# Patient Record
Sex: Female | Born: 1985
Health system: Southern US, Community
[De-identification: ages and names within clinical notes are randomized; demographics above are authoritative.]

## PROBLEM LIST (undated history)

## (undated) DIAGNOSIS — M1990 Unspecified osteoarthritis, unspecified site: Secondary | ICD-10-CM

## (undated) HISTORY — DX: Unspecified osteoarthritis, unspecified site: M19.90

---
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
## 2005-04-22 ENCOUNTER — Emergency Department (HOSPITAL_COMMUNITY): Admission: EM | Admit: 2005-04-22 | Discharge: 2005-04-22 | Payer: Self-pay | Admitting: Emergency Medicine

## 2005-06-12 ENCOUNTER — Inpatient Hospital Stay (HOSPITAL_COMMUNITY): Admission: AD | Admit: 2005-06-12 | Discharge: 2005-06-13 | Payer: Self-pay | Admitting: Family Medicine

## 2006-03-27 ENCOUNTER — Inpatient Hospital Stay (HOSPITAL_COMMUNITY): Admission: AD | Admit: 2006-03-27 | Discharge: 2006-03-27 | Payer: Self-pay | Admitting: Obstetrics and Gynecology

## 2006-07-17 ENCOUNTER — Inpatient Hospital Stay (HOSPITAL_COMMUNITY): Admission: AD | Admit: 2006-07-17 | Discharge: 2006-07-17 | Payer: Self-pay | Admitting: Obstetrics

## 2006-08-07 ENCOUNTER — Inpatient Hospital Stay (HOSPITAL_COMMUNITY): Admission: AD | Admit: 2006-08-07 | Discharge: 2006-08-07 | Payer: Self-pay | Admitting: Obstetrics

## 2006-08-22 ENCOUNTER — Inpatient Hospital Stay (HOSPITAL_COMMUNITY): Admission: AD | Admit: 2006-08-22 | Discharge: 2006-08-22 | Payer: Self-pay | Admitting: Obstetrics

## 2006-10-11 ENCOUNTER — Inpatient Hospital Stay (HOSPITAL_COMMUNITY): Admission: AD | Admit: 2006-10-11 | Discharge: 2006-10-13 | Payer: Self-pay

## 2008-11-02 ENCOUNTER — Encounter: Admission: RE | Admit: 2008-11-02 | Discharge: 2008-11-19 | Payer: Self-pay | Admitting: Orthopedic Surgery

## 2009-06-28 ENCOUNTER — Emergency Department (HOSPITAL_COMMUNITY): Admission: EM | Admit: 2009-06-28 | Discharge: 2009-06-28 | Payer: Self-pay | Admitting: Emergency Medicine

## 2011-01-01 LAB — POCT INFECTIOUS MONO SCREEN: Mono Screen: POSITIVE — AB

## 2011-01-01 LAB — POCT RAPID STREP A (OFFICE): Streptococcus, Group A Screen (Direct): NEGATIVE

## 2011-03-02 ENCOUNTER — Emergency Department (HOSPITAL_COMMUNITY): Payer: Medicaid Other

## 2011-03-02 ENCOUNTER — Other Ambulatory Visit (HOSPITAL_COMMUNITY): Payer: Self-pay

## 2011-03-02 ENCOUNTER — Emergency Department (HOSPITAL_COMMUNITY)
Admission: EM | Admit: 2011-03-02 | Discharge: 2011-03-03 | Disposition: A | Discharge status: Still In Hospital | Payer: Medicaid Other | Source: Home / Self Care | Attending: Emergency Medicine | Admitting: Emergency Medicine

## 2011-03-02 LAB — BASIC METABOLIC PANEL
CO2: 25 mEq/L (ref 19–32)
Chloride: 101 mEq/L (ref 96–112)
GFR calc Af Amer: >60 mL/min (ref >60)
Sodium: 137 mEq/L (ref 135–145)

## 2011-03-02 LAB — WET PREP, GENITAL
Clue Cells Wet Prep HPF POC: NONE SEEN
Yeast Wet Prep HPF POC: NONE SEEN

## 2011-03-02 LAB — URINE MICROSCOPIC-ADD ON

## 2011-03-02 LAB — URINALYSIS, ROUTINE W REFLEX MICROSCOPIC
Nitrite: NEGATIVE
Protein, ur: NEGATIVE mg/dL
Urobilinogen, UA: 0.2 mg/dL (ref 0.0–1.0)

## 2011-03-02 LAB — CBC
HCT: 35.6 % — ABNORMAL LOW (ref 36.0–46.0)
Hemoglobin: 12.2 g/dL (ref 12.0–15.0)
MCH: 31 pg (ref 26.0–34.0)
MCHC: 34.3 g/dL (ref 30.0–36.0)
MCV: 90.4 fL (ref 78.0–100.0)
Platelets: 240 10*3/uL (ref 150–400)
RBC: 3.94 MIL/uL (ref 3.87–5.11)
RDW: 13.7 % (ref 11.5–15.5)
WBC: 7.2 10*3/uL (ref 4.0–10.5)

## 2011-03-02 LAB — POCT PREGNANCY, URINE: Preg Test, Ur: POSITIVE

## 2011-03-03 ENCOUNTER — Ambulatory Visit (HOSPITAL_COMMUNITY)
Admission: AD | Admit: 2011-03-03 | Discharge: 2011-03-03 | Disposition: A | Discharge status: Home / Self Care | Payer: Medicaid Other | Source: Other Acute Inpatient Hospital | Attending: Obstetrics & Gynecology | Admitting: Obstetrics & Gynecology

## 2011-03-03 ENCOUNTER — Other Ambulatory Visit: Payer: Self-pay | Admitting: Obstetrics & Gynecology

## 2011-03-03 ENCOUNTER — Inpatient Hospital Stay (HOSPITAL_COMMUNITY): Payer: Medicaid Other

## 2011-03-03 DIAGNOSIS — O00109 Unspecified tubal pregnancy without intrauterine pregnancy: Secondary | ICD-10-CM | POA: Insufficient documentation

## 2011-03-03 LAB — CBC
Hemoglobin: 11.5 g/dL — ABNORMAL LOW (ref 12.0–15.0)
MCH: 31 pg (ref 26.0–34.0)
MCV: 92.2 fL (ref 78.0–100.0)
RBC: 3.71 MIL/uL — ABNORMAL LOW (ref 3.87–5.11)
WBC: 6.5 10*3/uL (ref 4.0–10.5)

## 2011-03-03 LAB — GC/CHLAMYDIA PROBE AMP, GENITAL: GC Probe Amp, Genital: NEGATIVE

## 2011-04-03 NOTE — Op Note (Signed)
NAMECHARVI, [name]                ACCOUNT NO.:  [id number]  MEDICAL RECORD NO.:  [id number]  LOCATION:  [facility]                          FACILITY:  [facility]  PHYSICIAN:  Genia Del, M.D.DATE OF BIRTH:  23-Sep-1986  DATE OF PROCEDURE:  03/03/2011 DATE OF DISCHARGE:                              OPERATIVE REPORT   PREOPERATIVE DIAGNOSIS:  Probable right ectopic pregnancy with hemoperitoneum.  POSTOPERATIVE DIAGNOSIS:  Confirmed right tubal ectopic pregnancy with hemoperitoneum.  PROCEDURE:  Laparoscopy with right salpingostomy, removal of right ectopic pregnancy, and evacuation of hemoperitoneum.  SURGEON:  Genia Del, MD  ANESTHESIOLOGIST:  Tyrone Apple. Malen Gauze, MD  PROCEDURE IN DETAIL:  Under general anesthesia with endotracheal intubation, the patient was in lithotomy position.  She was prepped with Betadine on the abdominal, suprapubic, vulvar, and vaginal areas.  The Foley was put in place in the bladder.  The speculum was introduced in the vagina.  The anterior lip of the cervix was grasped with a tenaculum and the acorn was used to manipulate the uterus.  We removed the speculum.  We then draped the patient as usual.  We went to the abdomen and infiltration of Marcaine one-quarter plain at the infraumbilical site.  We then made a 1.2 cm incision with a scalpel, opened the aponeurosis with Mayo scissors under direct vision.  We put a pursestring stitch of Vicryl 0 at the aponeurosis, introduced the Hasson at that level and the camera.  We inspected the abdominopelvic cavities and noted about 500 mL of hemoperitoneum.  We then put a 5-mm trocar on the right iliac area and another 5 on the left iliac area.  We infiltrated the skin with Marcaine one-quarter at both sides, used a scalpel for a 5-mm incision, and introduced those ports under direct vision.  We abundantly irrigated and suctioned the abdominopelvic cavities.  We noted a normal uterus in size and appearance,  normal left tube and normal left ovary.  On the right side, the right ovary is normal in size and appearance except for a large organized blood clot covering it and covering also the distal end of the tube.  There was no active bleeding but it seems like the patient had bled through the distal portion of the tube.  The tube was not ruptured but was distended with an ampullary ectopic pregnancy.  We used the point on the Nezhat to make a 2.5 cm incision apposite to the mesosalpinx.  We used the irrigation to lift the ectopic pregnancy at that level.  It was completely evacuated and removed with the clamps and sent to pathology. We then irrigated further, completed hemostasis with the point of Nezhat with coag mode.  We completed hemostasis on the serosa of the right tube edge.  Hemostasis was then verified and adequate.  We irrigated and suctioned again.  Pictures were taken before proceeding with evacuation of the hemoperitoneum and salpingostomy.  Pictures were also taken after.  No pathology was seen in the abdomen.  We had about 500 mL of hemoperitoneum when the procedure was started.  Estimated blood loss other than that was minimal.  All instruments were removed.  The trocars were removed under direct vision.  Pneumoperitoneum was evacuated.  We then closed the aponeurosis at the infraumbilical incision by attaching the pursestring stitch. We closed the 3 skin incision with a subcuticular stitch of Vicryl 4-0 and applied Dermabond on all incisions.  We also removed the instruments from the vagina.  The patient received Ancef 1 gram IV before starting the procedure.  The count of instruments and sponges was complete.  She was brought to recovery room in good and stable status.     Genia Del, M.D.     ML/MEDQ  D:  03/03/2011  T:  03/03/2011  Job:  [id number]  Electronically Signed by Genia Del M.D. on 04/03/2011 09:47:40 PM

## 2012-04-26 ENCOUNTER — Emergency Department (HOSPITAL_COMMUNITY): Payer: Medicaid Other

## 2012-04-26 ENCOUNTER — Encounter (HOSPITAL_COMMUNITY): Payer: Self-pay | Admitting: Cardiology

## 2012-04-26 ENCOUNTER — Emergency Department (HOSPITAL_COMMUNITY)
Admission: EM | Admit: 2012-04-26 | Discharge: 2012-04-26 | Disposition: A | Discharge status: Home / Self Care | Payer: Medicaid Other | Attending: Emergency Medicine | Admitting: Emergency Medicine

## 2012-04-26 DIAGNOSIS — O039 Complete or unspecified spontaneous abortion without complication: Secondary | ICD-10-CM

## 2012-04-26 LAB — POCT I-STAT, CHEM 8
Calcium, Ion: 1.24 mmol/L — ABNORMAL HIGH (ref 1.12–1.23)
Chloride: 104 mEq/L (ref 96–112)
Creatinine, Ser: 0.6 mg/dL (ref 0.50–1.10)
Glucose, Bld: 81 mg/dL (ref 70–99)
HCT: 36 % (ref 36.0–46.0)

## 2012-04-26 LAB — URINALYSIS, ROUTINE W REFLEX MICROSCOPIC
Glucose, UA: NEGATIVE mg/dL
Specific Gravity, Urine: 1.017 (ref 1.005–1.030)
Urobilinogen, UA: 1 mg/dL (ref 0.0–1.0)

## 2012-04-26 LAB — URINE MICROSCOPIC-ADD ON

## 2012-04-26 LAB — POCT PREGNANCY, URINE: Preg Test, Ur: POSITIVE — AB

## 2012-04-26 MED ORDER — OXYCODONE-ACETAMINOPHEN 5-325 MG PO TABS: 1.0000 | ORAL_TABLET | Freq: Four times a day (QID) | ORAL | Status: AC | PRN

## 2012-04-26 NOTE — ED Notes (Signed)
Pt reports that she is about 3 months pregnant and started having vaginal bleeding this morning. Reports using about 4 pads since 0500.

## 2012-04-26 NOTE — ED Provider Notes (Signed)
Medical screening examination/treatment/procedure(s) were conducted as a shared visit with non-physician practitioner(s) and myself.  I personally evaluated the patient during the encounter Is having vaginal bleeding and cramping. Ultrasound at bedside was inconclusive due to patient recently urinating. Formal ultrasound shows concern miscarriage with positive products of conception. Patient will followup with GYN for ongoing quadrants.  Kathleen Sprout, MD 04/26/12 1606

## 2012-04-26 NOTE — ED Provider Notes (Signed)
History     CSN: [id number]  Arrival date & time 04/26/12  1017   First MD Initiated Contact with Patient 04/26/12 1028      Chief Complaint  Patient presents with  . Vaginal Bleeding    (Consider location/radiation/quality/duration/timing/severity/associated sxs/prior treatment) HPI Comments: Patient is G3 P1, approximately [redacted] weeks pregnant presents with vaginal bleeding and lower abdominal and back cramping since yesterday. Patient states that the bleeding became heavier this morning. She has used about 4 pads since 5 AM. Patient also noted the passage of some clear fluid. She has not tried anything for the symptoms. Patient had an ultrasound performed approximately 2 weeks ago which she said was normal. Patient had a possible right side are ruptured ectopic pregnancy June 2012. She denies fever, nausea or vomiting, dysuria. Nothing makes symptoms better or worse. Onset was acute. Course is gradually worsening.  Patient is a 26 y.o. female presenting with vaginal bleeding. The history is provided by the patient.  Vaginal Bleeding This is a new problem. The current episode started yesterday. The problem occurs constantly. The problem has been unchanged. Associated symptoms include abdominal pain. Pertinent negatives include no anorexia, chest pain, coughing, fever, headaches, myalgias, nausea, rash, sore throat or vomiting. Nothing aggravates the symptoms. She has tried nothing for the symptoms.    History reviewed. No pertinent past medical history.  History reviewed. No pertinent past surgical history.  History reviewed. No pertinent family history.  History  Substance Use Topics  . Smoking status: Not on file  . Smokeless tobacco: Not on file  . Alcohol Use: Not on file    OB History    Grav Para Term Preterm Abortions TAB SAB Ect Mult Living                  Review of Systems  Constitutional: Negative for fever.  HENT: Negative for sore throat and rhinorrhea.   Eyes:  Negative for redness.  Respiratory: Negative for cough.   Cardiovascular: Negative for chest pain.  Gastrointestinal: Positive for abdominal pain. Negative for nausea, vomiting, diarrhea and anorexia.  Genitourinary: Positive for vaginal bleeding. Negative for dysuria.  Musculoskeletal: Negative for myalgias.  Skin: Negative for rash.  Neurological: Negative for headaches.    Allergies  Review of patient's allergies indicates no known allergies.  Home Medications  No current outpatient prescriptions on file.  BP 146/87  Pulse 63  Temp 97.2 F (36.2 C) (Oral)  Resp 18  SpO2 100%  Physical Exam  Nursing note and vitals reviewed. Constitutional: She appears well-developed and well-nourished.  HENT:  Head: Normocephalic and atraumatic.  Eyes: Conjunctivae are normal. Right eye exhibits no discharge. Left eye exhibits no discharge.  Neck: Normal range of motion. Neck supple.  Cardiovascular: Normal rate, regular rhythm and normal heart sounds.   Pulmonary/Chest: Effort normal and breath sounds normal.  Abdominal: Soft. There is tenderness in the right lower quadrant, suprapubic area and left lower quadrant. There is no rigidity, no rebound, no guarding, no CVA tenderness, no tenderness at McBurney's point and negative Murphy's sign.  Genitourinary: Cervix exhibits discharge. Right adnexum displays no mass, no tenderness and no fullness. Left adnexum displays no mass and no fullness. Vaginal discharge found.       Moderate amount of dark red blood noted in vaginal vault, ?Products of conception, amniotic sac in vaginal tract.  Neurological: She is alert.  Skin: Skin is warm and dry.  Psychiatric: She has a normal mood and affect.    ED  Course  Procedures (including critical care time)  Labs Reviewed  URINALYSIS, ROUTINE W REFLEX MICROSCOPIC - Abnormal; Notable for the following:    APPearance CLOUDY (*)     Hgb urine dipstick LARGE (*)     Ketones, ur 15 (*)     Nitrite  POSITIVE (*)     Leukocytes, UA SMALL (*)     All other components within normal limits  POCT PREGNANCY, URINE - Abnormal; Notable for the following:    Preg Test, Ur POSITIVE (*)     All other components within normal limits  URINE MICROSCOPIC-ADD ON - Abnormal; Notable for the following:    Squamous Epithelial / LPF FEW (*)     Bacteria, UA MANY (*)     All other components within normal limits  POCT I-STAT, CHEM 8 - Abnormal; Notable for the following:    BUN <3 (*)     Calcium, Ion 1.24 (*)     All other components within normal limits  HCG, QUANTITATIVE, PREGNANCY   US Ob Comp Less 14 Wks  04/26/2012  *RADIOLOGY REPORT*  Clinical Data: Abnormal bleeding; EGA by LMP is 10 weeks 6 days  OBSTETRIC <14 WK [place] AND TRANSVAGINAL OB US  Technique:  Both transabdominal and transvaginal ultrasound examinations were performed for complete evaluation of the gestation as well as the maternal uterus, adnexal regions, and pelvic cul-de-sac.  Transvaginal technique was performed to assess early pregnancy.  Comparison:  None.  There is no beta HCG level for correlation.  Intrauterine gestational sac:  None  Yolk sac: None Embryo: No Cardiac Activity: No  Maternal uterus/adnexae: The endometrial stripe at the fundus is thin, measuring 11 mm. There is a thick, heterogeneous material at the lower uterine segment, however.  Both ovaries have a normal size and appearance.  There are no adnexal masses or free pelvic fluid.  IMPRESSION: There is no evidence of intrauterine or ectopic pregnancy.  There is heterogeneous material at the lower uterine segment which is worrisome for hemorrhagic products and passing products of conception with spontaneous abortion, given history of heavy bleeding and positive pregnancy test.  Original Report Authenticated By: Brandon Melnick, M.D.   US Ob Transvaginal  04/26/2012  *RADIOLOGY REPORT*  Clinical Data: Abnormal bleeding; EGA by LMP is 10 weeks 6 days  OBSTETRIC <14 WK [place] AND  TRANSVAGINAL OB US  Technique:  Both transabdominal and transvaginal ultrasound examinations were performed for complete evaluation of the gestation as well as the maternal uterus, adnexal regions, and pelvic cul-de-sac.  Transvaginal technique was performed to assess early pregnancy.  Comparison:  None.  There is no beta HCG level for correlation.  Intrauterine gestational sac:  None  Yolk sac: None Embryo: No Cardiac Activity: No  Maternal uterus/adnexae: The endometrial stripe at the fundus is thin, measuring 11 mm. There is a thick, heterogeneous material at the lower uterine segment, however.  Both ovaries have a normal size and appearance.  There are no adnexal masses or free pelvic fluid.  IMPRESSION: There is no evidence of intrauterine or ectopic pregnancy.  There is heterogeneous material at the lower uterine segment which is worrisome for hemorrhagic products and passing products of conception with spontaneous abortion, given history of heavy bleeding and positive pregnancy test.  Original Report Authenticated By: Brandon Melnick, M.D.     1. Miscarriage     10:56 AM Patient seen and examined. Work-up initiated.    Vital signs reviewed and are as follows: Filed Vitals:  04/26/12 1028  BP: 146/87  Pulse: 63  Temp: 97.2 F (36.2 C)  Resp: 18   [place] confirmed miscarriage. Istat, quant ordered for follow-up purposes. Patient informed.   Urged OB follow-up. Urged return if worsening severe bleeding, lightheadedness, SOB. Told patient to expect passage of products of conception. Patient verbalizes understanding and agrees with plan.   MDM  Patient with miscarriage, supported by physical exam and US findings. Do not suspect clinically significant hemorrhage. Patient has OB follow-up. She appears well at time of discharge.         Renne Crigler, [place] 04/26/12 1550

## 2013-01-07 ENCOUNTER — Emergency Department (HOSPITAL_COMMUNITY)
Admission: EM | Admit: 2013-01-07 | Discharge: 2013-01-07 | Disposition: A | Discharge status: Home / Self Care | Payer: Self-pay | Attending: Emergency Medicine | Admitting: Emergency Medicine

## 2013-01-07 ENCOUNTER — Encounter (HOSPITAL_COMMUNITY): Payer: Self-pay | Admitting: Physical Medicine and Rehabilitation

## 2013-01-07 DIAGNOSIS — Z87891 Personal history of nicotine dependence: Secondary | ICD-10-CM | POA: Insufficient documentation

## 2013-01-07 DIAGNOSIS — J029 Acute pharyngitis, unspecified: Secondary | ICD-10-CM | POA: Insufficient documentation

## 2013-01-07 MED ORDER — PREDNISOLONE SODIUM PHOSPHATE 15 MG/5ML PO SOLN
30.0000 mg | Freq: Once | ORAL | Status: AC
  Administered 2013-01-07: 30 mg via ORAL
  Filled 2013-01-07: qty 2

## 2013-01-07 MED ORDER — PENICILLIN G BENZATHINE 1200000 UNIT/2ML IM SUSP
1.2000 10*6.[IU] | Freq: Once | INTRAMUSCULAR | Status: AC
  Administered 2013-01-07: 1.2 10*6.[IU] via INTRAMUSCULAR
  Filled 2013-01-07: qty 2

## 2013-01-07 NOTE — ED Notes (Signed)
Pt presents to department for evaluation of sore throat. Ongoing x3 days. Pt states no relief of symptoms at home. 8/10 pain at the time. Pt is alert and oriented x4.

## 2013-01-07 NOTE — ED Provider Notes (Signed)
History  This chart was scribed for non-physician practitioner Roxy Horseman, PA-C working with Glynn Octave, MD, by Candelaria Stagers, ED Scribe. This patient was seen in room TR06C/TR06C and the patient's care was started at 3:57 PM   CSN: [id number]  Arrival date & time 01/07/13  1542   First MD Initiated Contact with Patient 01/07/13 1550      Chief Complaint  Patient presents with  . Sore Throat    The history is provided by the patient. No language interpreter was used.   [name] is a 27 y.o. female who presents to the Emergency Department complaining of severe sore throat that started three days ago.  Swallowing makes the pain worse.  Pt denies fever, nausea, or vomiting.  She reports some ill contacts at work.  Pt has taken nothing for the pain.   No past medical history on file.  No past surgical history on file.  No family history on file.  History  Substance Use Topics  . Smoking status: Former Games developer  . Smokeless tobacco: Not on file  . Alcohol Use: No    OB History   Grav Para Term Preterm Abortions TAB SAB Ect Mult Living                  Review of Systems  Constitutional: Negative for fever and chills.  HENT: Positive for sore throat.   Gastrointestinal: Negative for nausea and vomiting.  All other systems reviewed and are negative.    Allergies  Review of patient's allergies indicates no known allergies.  Home Medications  No current outpatient prescriptions on file.  BP 131/84  Pulse 68  Temp(Src) 98.7 F (37.1 C) (Oral)  Resp 18  SpO2 97%  Physical Exam  Nursing note and vitals reviewed. Constitutional: She is oriented to person, place, and time. She appears well-developed and well-nourished. No distress.  HENT:  Head: Normocephalic and atraumatic.  Oropharynx red and inflamed with tonsillar exudates. Airway intact.  Uvula midline.  No signs of tonsillar or peri tonsillar abscesses.   Eyes: EOM are normal.  Neck: Normal range  of motion. Neck supple. No tracheal deviation present.  Cardiovascular: Normal rate, regular rhythm and normal heart sounds.  Exam reveals no gallop and no friction rub.   No murmur heard. Pulmonary/Chest: Effort normal and breath sounds normal. No respiratory distress. She has no wheezes. She has no rales.  Musculoskeletal: Normal range of motion.  Lymphadenopathy:    She has cervical adenopathy.  Neurological: She is alert and oriented to person, place, and time.  Skin: Skin is warm and dry.  Psychiatric: She has a normal mood and affect. Her behavior is normal.    ED Course  Procedures   DIAGNOSTIC STUDIES: Oxygen Saturation is 97% on room air, normal by my interpretation.    COORDINATION OF CARE:  4:00 PM Discussed course of care with pt which includes antibiotic and steroid.  Pt understands and agrees.    Labs Reviewed - No data to display No results found.   1. Pharyngitis       MDM  Patient with pharyngitis. No signs of abscess or Ludwig's angina. Presence of exudates, cervical adenopathy, and abscess of cough.  Patient has not measured a temperature.  Will treat with penicillin IM and give a dose of orapred.  Follow-up with PCP.  Tylenol and motrin for pain control.    I personally performed the services described in this documentation, which was scribed in my presence. The  recorded information has been reviewed and is accurate.        Roxy Horseman, PA-C 01/07/13 1609

## 2013-01-07 NOTE — ED Provider Notes (Signed)
Medical screening examination/treatment/procedure(s) were performed by non-physician practitioner and as supervising physician I was immediately available for consultation/collaboration.   Jaquise Faux, MD 01/07/13 2020 

## 2014-05-11 ENCOUNTER — Emergency Department (HOSPITAL_COMMUNITY)
Admission: EM | Admit: 2014-05-11 | Discharge: 2014-05-11 | Disposition: A | Discharge status: Home / Self Care | Payer: Medicaid Other | Attending: Emergency Medicine | Admitting: Emergency Medicine

## 2014-05-11 ENCOUNTER — Encounter (HOSPITAL_COMMUNITY): Payer: Self-pay | Admitting: Emergency Medicine

## 2014-05-11 ENCOUNTER — Emergency Department (HOSPITAL_COMMUNITY): Payer: Medicaid Other

## 2014-05-11 DIAGNOSIS — S8990XA Unspecified injury of unspecified lower leg, initial encounter: Secondary | ICD-10-CM | POA: Insufficient documentation

## 2014-05-11 DIAGNOSIS — Z87891 Personal history of nicotine dependence: Secondary | ICD-10-CM | POA: Insufficient documentation

## 2014-05-11 DIAGNOSIS — S99919A Unspecified injury of unspecified ankle, initial encounter: Secondary | ICD-10-CM | POA: Insufficient documentation

## 2014-05-11 DIAGNOSIS — M79671 Pain in right foot: Secondary | ICD-10-CM

## 2014-05-11 DIAGNOSIS — Y92009 Unspecified place in unspecified non-institutional (private) residence as the place of occurrence of the external cause: Secondary | ICD-10-CM | POA: Insufficient documentation

## 2014-05-11 DIAGNOSIS — Y9389 Activity, other specified: Secondary | ICD-10-CM | POA: Insufficient documentation

## 2014-05-11 DIAGNOSIS — S99929A Unspecified injury of unspecified foot, initial encounter: Principal | ICD-10-CM

## 2014-05-11 DIAGNOSIS — R296 Repeated falls: Secondary | ICD-10-CM | POA: Insufficient documentation

## 2014-05-11 MED ORDER — NAPROXEN 500 MG PO TABS: 500.0000 mg | ORAL_TABLET | Freq: Two times a day (BID) | ORAL | Status: DC

## 2014-05-11 NOTE — ED Notes (Signed)
Rt foot pain  Since, yesterday fell off trash can trying to get into house she had locked herself out of good pulse can wiggle toes and good neuro and blanche

## 2014-05-11 NOTE — Discharge Instructions (Signed)
Take the prescribed medication as directed. °Return to the ED for new or worsening symptoms. ° °

## 2014-05-11 NOTE — ED Provider Notes (Signed)
Medical screening examination/treatment/procedure(s) were performed by non-physician practitioner and as supervising physician I was immediately available for consultation/collaboration.   EKG Interpretation None        Lyanne CoKevin M Starlet Gallentine, MD 05/11/14 2204

## 2014-05-11 NOTE — ED Provider Notes (Signed)
CSN: [id number]     Arrival date & time 05/11/14  [id number] History   First MD Initiated Contact with Patient 05/11/14 [phone]     Chief Complaint  Patient presents with  . Foot Pain     (Consider location/radiation/quality/duration/timing/severity/associated sxs/prior Treatment) The history is provided by the patient and medical records.   This is a 28 y.o. F with no significant PMH presenting to the ED for right foot pain.  Patient states yesterday she was standing on a trash can attempting to crawl into her home after she locked herself out of the house, states trash can tipped over and she fell on her right foot with it dorsiflexed.  No head injury or LOC. States she has been ambulatory since fall but is limping.  Did have some numbness and paresthesias in her foot last night, however this has since resolved.  Has elevated and iced her foot without noted improvement.  Prior right ankle injury several years ago.  History reviewed. No pertinent past medical history. History reviewed. No pertinent past surgical history. No family history on file. History  Substance Use Topics  . Smoking status: Former Games developermoker  . Smokeless tobacco: Not on file  . Alcohol Use: No   OB History   Grav Para Term Preterm Abortions TAB SAB Ect Mult Living                 Review of Systems  Musculoskeletal: Positive for arthralgias.  All other systems reviewed and are negative.     Allergies  Review of patient's allergies indicates no known allergies.  Home Medications   Prior to Admission medications   Not on File   There were no vitals taken for this visit.  Physical Exam  Nursing note and vitals reviewed. Constitutional: She is oriented to person, place, and time. She appears well-developed and well-nourished.  HENT:  Head: Normocephalic and atraumatic.  Mouth/Throat: Oropharynx is clear and moist.  Eyes: Conjunctivae and EOM are normal. Pupils are equal, round, and reactive to light.  Neck:  Normal range of motion.  Cardiovascular: Normal rate, regular rhythm and normal heart sounds.   Pulmonary/Chest: Effort normal and breath sounds normal. No respiratory distress. She has no wheezes.  Musculoskeletal: Normal range of motion.       Right foot: She exhibits tenderness, bony tenderness and swelling.  Mild swelling and tenderness to right dorsal foot, no gross bony deformity, moving all toes appropriately, full range of motion of the ankle, DP pulse and sensation intact  Neurological: She is alert and oriented to person, place, and time.  Skin: Skin is warm and dry.  Psychiatric: She has a normal mood and affect.    ED Course  Procedures (including critical care time) Labs Review Labs Reviewed - No data to display  Imaging Review No results found.   EKG Interpretation None      MDM   Final diagnoses:  Foot pain, right   Imaging negative for acute fx.  Foot remains NVI.  Foot ace wrapped, patient discharged home with naprosyn.  Continue RICE routine.  Discussed plan with patient, he/she acknowledged understanding and agreed with plan of care.  Return precautions given for new or worsening symptoms.  Garlon HatchetLisa M Samuella Rasool, PA-C 05/11/14 1003

## 2015-01-01 ENCOUNTER — Encounter (HOSPITAL_COMMUNITY): Payer: Self-pay | Admitting: Emergency Medicine

## 2015-01-01 ENCOUNTER — Emergency Department (HOSPITAL_COMMUNITY): Payer: Medicaid Other

## 2015-01-01 ENCOUNTER — Emergency Department (HOSPITAL_COMMUNITY)
Admission: EM | Admit: 2015-01-01 | Discharge: 2015-01-01 | Disposition: A | Discharge status: Home / Self Care | Payer: Medicaid Other | Attending: Emergency Medicine | Admitting: Emergency Medicine

## 2015-01-01 DIAGNOSIS — Y9289 Other specified places as the place of occurrence of the external cause: Secondary | ICD-10-CM | POA: Insufficient documentation

## 2015-01-01 DIAGNOSIS — W010XXA Fall on same level from slipping, tripping and stumbling without subsequent striking against object, initial encounter: Secondary | ICD-10-CM | POA: Insufficient documentation

## 2015-01-01 DIAGNOSIS — Z791 Long term (current) use of non-steroidal anti-inflammatories (NSAID): Secondary | ICD-10-CM | POA: Insufficient documentation

## 2015-01-01 DIAGNOSIS — Y9389 Activity, other specified: Secondary | ICD-10-CM | POA: Insufficient documentation

## 2015-01-01 DIAGNOSIS — Z87891 Personal history of nicotine dependence: Secondary | ICD-10-CM | POA: Insufficient documentation

## 2015-01-01 DIAGNOSIS — M25462 Effusion, left knee: Secondary | ICD-10-CM

## 2015-01-01 DIAGNOSIS — S8992XA Unspecified injury of left lower leg, initial encounter: Secondary | ICD-10-CM | POA: Insufficient documentation

## 2015-01-01 DIAGNOSIS — R52 Pain, unspecified: Secondary | ICD-10-CM

## 2015-01-01 DIAGNOSIS — Y998 Other external cause status: Secondary | ICD-10-CM | POA: Insufficient documentation

## 2015-01-01 MED ORDER — NAPROXEN 500 MG PO TABS: 500.0000 mg | ORAL_TABLET | Freq: Two times a day (BID) | ORAL | Status: DC

## 2015-01-01 NOTE — ED Notes (Addendum)
Pt has left knee pain; can bear weight but too painful. Been hopping around. Iced yesterday and helped some. Pt slipped and fell and thinks knee popped out 2 days ago.

## 2015-01-01 NOTE — Discharge Instructions (Signed)
Take naproxen as directed. When you are at home, you may take the knee immobilizer off, elevate your knee and ice it. Wear the immobilizer overnight. Follow-up with orthopedics.  Knee Effusion The medical term for having fluid in your knee is effusion. This is often due to an internal derangement of the knee. This means something is wrong inside the knee. Some of the causes of fluid in the knee may be torn cartilage, a torn ligament, or bleeding into the joint from an injury. Your knee is likely more difficult to bend and move. This is often because there is increased pain and pressure in the joint. The time it takes for recovery from a knee effusion depends on different factors, including:   Type of injury.  Your age.  Physical and medical conditions.  Rehabilitation Strategies. How long you will be away from your normal activities will depend on what kind of knee problem you have and how much damage is present. Your knee has two types of cartilage. Articular cartilage covers the bone ends and lets your knee bend and move smoothly. Two menisci, thick pads of cartilage that form a rim inside the joint, help absorb shock and stabilize your knee. Ligaments bind the bones together and support your knee joint. Muscles move the joint, help support your knee, and take stress off the joint itself. CAUSES  Often an effusion in the knee is caused by an injury to one of the menisci. This is often a tear in the cartilage. Recovery after a meniscus injury depends on how much meniscus is damaged and whether you have damaged other knee tissue. Small tears may heal on their own with conservative treatment. Conservative means rest, limited weight bearing activity and muscle strengthening exercises. Your recovery may take up to 6 weeks.  TREATMENT  Larger tears may require surgery. Meniscus injuries may be treated during arthroscopy. Arthroscopy is a procedure in which your surgeon uses a small telescope like  instrument to look in your knee. Your caregiver can make a more accurate diagnosis (learning what is wrong) by performing an arthroscopic procedure. If your injury is on the inner margin of the meniscus, your surgeon may trim the meniscus back to a smooth rim. In other cases your surgeon will try to repair a damaged meniscus with stitches (sutures). This may make rehabilitation take longer, but may provide better long term result by helping your knee keep its shock absorption capabilities. Ligaments which are completely torn usually require surgery for repair. HOME CARE INSTRUCTIONS  Use crutches as instructed.  If a brace is applied, use as directed.  Once you are home, an ice pack applied to your swollen knee may help with discomfort and help decrease swelling.  Keep your knee raised (elevated) when you are not up and around or on crutches.  Only take over-the-counter or prescription medicines for pain, discomfort, or fever as directed by your caregiver.  Your caregivers will help with instructions for rehabilitation of your knee. This often includes strengthening exercises.  You may resume a normal diet and activities as directed. SEEK MEDICAL CARE IF:   There is increased swelling in your knee.  You notice redness, swelling, or increasing pain in your knee.  An unexplained oral temperature above 102 F (38.9 C) develops. SEEK IMMEDIATE MEDICAL CARE IF:   You develop a rash.  You have difficulty breathing.  You have any allergic reactions from medications you may have been given.  There is severe pain with any motion of  the knee. MAKE SURE YOU:   Understand these instructions.  Will watch your condition.  Will get help right away if you are not doing well or get worse. Document Released: 12/05/2003 Document Revised: 12/07/2011 Document Reviewed: 02/08/2008 [facility] Patient Information 2015 [place] [place]. This information is not intended to replace advice given to you  by your health care provider. Make sure you discuss any questions you have with your health care provider.

## 2015-01-01 NOTE — ED Provider Notes (Signed)
CSN: [id number]     Arrival date & time 01/01/15  0944 History  This chart was scribed for non-physician practitioner, Celene Skeenobyn Antonino Nienhuis, PA-C working with Layla MawKristen N Ward, DO by Greggory StallionKayla Andersen, ED scribe. This patient was seen in room TR07C/TR07C and the patient's care was started at 10:28 AM.    Chief Complaint  Patient presents with  . Knee Pain   The history is provided by the patient. No language interpreter was used.    HPI Comments: [name] is a 29 y.o. female who presents to the Emergency Department complaining of left knee injury that occurred 2 days ago. Pt slipped, fell, and landed on her left knee. She felt her knee pop out of place but states it popped back on its own. Pt reports sudden onset pain with associated swelling. Bearing weight worsens pain but rest relives some of it. She has iced with some relief.   History reviewed. No pertinent past medical history. History reviewed. No pertinent past surgical history. History reviewed. No pertinent family history. History  Substance Use Topics  . Smoking status: Former Games developermoker  . Smokeless tobacco: Not on file  . Alcohol Use: No   OB History    No data available     Review of Systems  Musculoskeletal: Positive for joint swelling and arthralgias.  All other systems reviewed and are negative.  Allergies  Review of patient's allergies indicates no known allergies.  Home Medications   Prior to Admission medications   Medication Sig Start Date End Date Taking? Authorizing Provider  naproxen (NAPROSYN) 500 MG tablet Take 1 tablet (500 mg total) by mouth 2 (two) times daily with a meal. 05/11/14   Garlon HatchetLisa M Sanders, PA-C  naproxen (NAPROSYN) 500 MG tablet Take 1 tablet (500 mg total) by mouth 2 (two) times daily. 01/01/15   Megan Presti M Samyukta Cura, PA-C   BP 139/62 mmHg  Pulse 75  Temp(Src) 98.7 F (37.1 C) (Oral)  Resp 18  Ht 5\' 8"  (1.727 m)  Wt 225 lb (102.059 kg)  BMI 34.22 kg/m2  SpO2 100%  LMP 12/09/2014   Physical Exam   Constitutional: She is oriented to person, place, and time. She appears well-developed and well-nourished. No distress.  HENT:  Head: Normocephalic and atraumatic.  Mouth/Throat: Oropharynx is clear and moist.  Eyes: Conjunctivae and EOM are normal.  Neck: Normal range of motion. Neck supple.  Cardiovascular: Normal rate, regular rhythm and normal heart sounds.   Pulmonary/Chest: Effort normal and breath sounds normal. No respiratory distress.  Musculoskeletal: Normal range of motion. She exhibits no edema.  Left knee with tenderness to palpation over patella and patellar tendon. Full ROM. Pain with extension. Unable to assess ligamentous laxity due to pain.   Neurological: She is alert and oriented to person, place, and time. No sensory deficit.  Skin: Skin is warm and dry.  Psychiatric: She has a normal mood and affect. Her behavior is normal.  Nursing note and vitals reviewed.   ED Course  Procedures (including critical care time)  DIAGNOSTIC STUDIES: Oxygen Saturation is 100% on RA, normal by my interpretation.    COORDINATION OF CARE: 10:30 AM-Discussed treatment plan which includes xray with pt at bedside and pt agreed to plan.   Labs Review Labs Reviewed - No data to display  Imaging Review Dg Knee Complete 4 Views Left  01/01/2015   CLINICAL DATA:  Slip and fall 2 days ago landing on the left knee. Swelling and pain. History of instability.  EXAM:  LEFT KNEE - COMPLETE 4+ VIEW  COMPARISON:  Report from 10/27/2000  FINDINGS: Large knee effusion. Tricompartmental spurring with mild to moderate loss of articular space in the medial and lateral compartments. There is some articular spurring along the lateral femoral condyle.  I do not observe a fracture. Borderline high position of the patella/patella alta, correlate with any instability the patella on physical exam.  IMPRESSION: 1. Large knee effusion. 2. Tricompartmental spurring. 3. Borderline appearance for patella alta.  Correlate with any obvious instability of the patella on physical exam in assessing for patellar tendon disruption.   Electronically Signed   By: Gaylyn Rong M.D.   On: 01/01/2015 11:36     EKG Interpretation None      MDM   Final diagnoses:  Knee effusion, left  Left knee injury, initial encounter   Neurovascularly intact. Able to fully flex and extend knee. Cannot rule out ligamentous or meniscal injury. Doubt patella tendon disruption. It is possible she dislocated and then relocated her patella. Knee immobilizer applied. Crutches given. Rest, ice, elevation, NSAIDs. Follow-up with orthopedics. She has seen [facility] orth[facility] in the past. Stable for discharge. Return precautions given. Patient states understanding of treatment care plan and is agreeable.  I personally performed the services described in this documentation, which was scribed in my presence. The recorded information has been reviewed and is accurate.   Kathrynn Speed, PA-C 01/01/15 1147  Layla Maw Ward, DO 01/01/15 [phone]

## 2015-01-18 ENCOUNTER — Encounter (HOSPITAL_COMMUNITY): Payer: Self-pay | Admitting: Emergency Medicine

## 2015-01-18 ENCOUNTER — Emergency Department (HOSPITAL_COMMUNITY)
Admission: EM | Admit: 2015-01-18 | Discharge: 2015-01-18 | Disposition: A | Discharge status: Home / Self Care | Payer: Medicaid Other | Attending: Emergency Medicine | Admitting: Emergency Medicine

## 2015-01-18 DIAGNOSIS — Z87891 Personal history of nicotine dependence: Secondary | ICD-10-CM | POA: Insufficient documentation

## 2015-01-18 DIAGNOSIS — S238XXA Sprain of other specified parts of thorax, initial encounter: Secondary | ICD-10-CM | POA: Insufficient documentation

## 2015-01-18 DIAGNOSIS — X58XXXA Exposure to other specified factors, initial encounter: Secondary | ICD-10-CM | POA: Insufficient documentation

## 2015-01-18 DIAGNOSIS — S233XXA Sprain of ligaments of thoracic spine, initial encounter: Secondary | ICD-10-CM

## 2015-01-18 DIAGNOSIS — Y9389 Activity, other specified: Secondary | ICD-10-CM | POA: Insufficient documentation

## 2015-01-18 DIAGNOSIS — S3992XA Unspecified injury of lower back, initial encounter: Secondary | ICD-10-CM | POA: Insufficient documentation

## 2015-01-18 DIAGNOSIS — Y998 Other external cause status: Secondary | ICD-10-CM | POA: Insufficient documentation

## 2015-01-18 DIAGNOSIS — S29012A Strain of muscle and tendon of back wall of thorax, initial encounter: Secondary | ICD-10-CM | POA: Insufficient documentation

## 2015-01-18 DIAGNOSIS — Y9289 Other specified places as the place of occurrence of the external cause: Secondary | ICD-10-CM | POA: Insufficient documentation

## 2015-01-18 DIAGNOSIS — Z791 Long term (current) use of non-steroidal anti-inflammatories (NSAID): Secondary | ICD-10-CM | POA: Insufficient documentation

## 2015-01-18 MED ORDER — METHOCARBAMOL 500 MG PO TABS: 1000.0000 mg | ORAL_TABLET | Freq: Four times a day (QID) | ORAL | Status: DC | PRN

## 2015-01-18 MED ORDER — METHOCARBAMOL 500 MG PO TABS
1000.0000 mg | ORAL_TABLET | Freq: Once | ORAL | Status: AC
  Administered 2015-01-18: 1000 mg via ORAL
  Filled 2015-01-18: qty 2

## 2015-01-18 MED ORDER — KETOROLAC TROMETHAMINE 60 MG/2ML IM SOLN
30.0000 mg | Freq: Once | INTRAMUSCULAR | Status: AC
  Administered 2015-01-18: 30 mg via INTRAMUSCULAR
  Filled 2015-01-18: qty 2

## 2015-01-18 NOTE — ED Provider Notes (Signed)
CSN: [id number]     Arrival date & time 01/18/15  1305 History  This chart was scribed for non-physician practitioner Wynetta EmeryNicole Harshita Bernales, PA working with Benjiman CoreNathan Pickering, MD, by Tanda RockersMargaux Venter, ED Scribe. This patient was seen in room TR01C/TR01C and the patient's care was started at 1:57 PM.    Chief Complaint  Patient presents with  . Back Pain   The history is provided by the patient. No language interpreter was used.     HPI Comments: [name] is a 29 y.o. female who presents to the Emergency Department complaining of severe, sudden onset, constant low back pain that began today after lifting a heavy box. Pt rates the pain as a 10/10 on the pain scale. She reports that the pain is exacerbated with movement of any kind. She has not taken anything for the pain. Denies fever, chills, change in bowel or bladder habits, h/o IDVU or cancer, numbness or weakness.    History reviewed. No pertinent past medical history. History reviewed. No pertinent past surgical history. History reviewed. No pertinent family history. History  Substance Use Topics  . Smoking status: Former Games developermoker  . Smokeless tobacco: Not on file  . Alcohol Use: Yes   OB History    No data available     Review of Systems  A complete 10 system review of systems was obtained and all systems are negative except as noted in the HPI and PMH.    Allergies  Review of patient's allergies indicates no known allergies.  Home Medications   Prior to Admission medications   Medication Sig Start Date End Date Taking? Authorizing Provider  methocarbamol (ROBAXIN) 500 MG tablet Take 2 tablets (1,000 mg total) by mouth 4 (four) times daily as needed (Pain). 01/18/15   Chazlyn Cude, PA-C  naproxen (NAPROSYN) 500 MG tablet Take 1 tablet (500 mg total) by mouth 2 (two) times daily with a meal. 05/11/14   Garlon HatchetLisa M Sanders, PA-C  naproxen (NAPROSYN) 500 MG tablet Take 1 tablet (500 mg total) by mouth 2 (two) times daily. 01/01/15    Kathrynn Speedobyn M Hess, PA-C   Triage Vitals: BP 144/80 mmHg  Pulse 64  Temp(Src) 97.6 F (36.4 C) (Oral)  Resp 24  SpO2 96%  LMP 12/09/2014   Physical Exam  Constitutional: She is oriented to person, place, and time. She appears well-developed and well-nourished. No distress.  HENT:  Head: Normocephalic and atraumatic.  Eyes: Conjunctivae and EOM are normal.  Neck: Neck supple. No tracheal deviation present.  Cardiovascular: Normal rate.   Pulmonary/Chest: Effort normal. No respiratory distress.  Musculoskeletal: Normal range of motion. She exhibits tenderness.  No point tenderness to percussion of lumbar spinal processes.  +rught lumbar paraspinal TTP and muscular spasm. Strength is 5 out of 5 to bilateral lower extremities at hip and knee  Patellar reflexes are 2+ bilaterally.       Neurological: She is alert and oriented to person, place, and time.  Skin: Skin is warm and dry.  Psychiatric: She has a normal mood and affect. Her behavior is normal.  Nursing note and vitals reviewed.   ED Course  Procedures (including critical care time)  DIAGNOSTIC STUDIES: Oxygen Saturation is 96% on RA, normal by my interpretation.    COORDINATION OF CARE: 2:02 PM-Discussed treatment plan which includes pain medication with pt at bedside and pt agreed to plan.   Labs Review Labs Reviewed - No data to display  Imaging Review No results found.   EKG Interpretation  None      MDM   Final diagnoses:  Thoracic sprain and strain, initial encounter    Filed Vitals:   01/18/15 1327 01/18/15 1446  BP: 144/80 126/71  Pulse: 64 63  Temp: 97.6 F (36.4 C) 98 F (36.7 C)  TempSrc: Oral Oral  Resp: 24 20  SpO2: 96% 100%    Medications  ketorolac (TORADOL) injection 30 mg (30 mg Intramuscular Given 01/18/15 1410)  methocarbamol (ROBAXIN) tablet 1,000 mg (1,000 mg Oral Given 01/18/15 1410)    [name] is a pleasant 29 y.o. female presenting with  back pain.  No neurological  deficits and normal neuro exam.  Patient can walk but states is painful.  No loss of bowel or bladder control.  No concern for cauda equina.  No fever, night sweats, weight loss, h/o cancer, IVDU.  RICE protocol and pain medicine indicated and discussed with patient.  Evaluation does not show pathology that would require ongoing emergent intervention or inpatient treatment. Pt is hemodynamically stable and mentating appropriately. Discussed findings and plan with patient/guardian, who agrees with care plan. All questions answered. Return precautions discussed and outpatient follow up given.   Discharge Medication List as of 01/18/2015  2:04 PM    START taking these medications   Details  methocarbamol (ROBAXIN) 500 MG tablet Take 2 tablets (1,000 mg total) by mouth 4 (four) times daily as needed (Pain)., Starting 01/18/2015, Until Discontinued, Print         I personally performed the services described in this documentation, which was scribed in my presence. The recorded information has been reviewed and is accurate.      Wynetta Emery, PA-C 01/18/15 2311  Benjiman Core, MD 01/21/15 [phone]

## 2015-01-18 NOTE — Discharge Instructions (Signed)
Starting tomorrow :For pain control you may take up to 800mg  of Motrin (also known as ibuprofen). That is usually 4 over the counter pills,  3 times a day. Take with food to minimize stomach irritation   You can also take  tylenol (acetaminophen) 975mg  (this is 3 over the counter pills) four times a day. Do not drink alcohol or combine with other medications that have acetaminophen as an ingredient (Read the labels!).    For breakthrough pain you may take Robaxin. Do not drink alcohol, drive or operate heavy machinery when taking Robaxin.  Do not hesitate to return to the emergency room for any new, worsening or concerning symptoms.  Please obtain primary care using resource guide below. But the minute you were seen in the emergency room and that they will need to obtain records for further outpatient management.   Mid-Back Strain with Rehab  A strain is an injury in which a tendon or muscle is torn. The muscles and tendons of the mid-back are vulnerable to strains. However, these muscles and tendons are very strong and require a great force to be injured. The muscles of the mid-back are responsible for stabilizing the spinal column, as well as spinal twisting (rotation). Strains are classified into three categories. Grade 1 strains cause pain, but the tendon is not lengthened. Grade 2 strains include a lengthened ligament, due to the ligament being stretched or partially ruptured. With grade 2 strains there is still function, although the function may be decreased. Grade 3 strains involve a complete tear of the tendon or muscle, and function is usually impaired. SYMPTOMS   Pain in the middle of the back.  Pain that may affect only one side, and is worse with movement.  Muscle spasms, and often swelling in the back.  Loss of strength of the back muscles.  Crackling sound (crepitation) when the muscles are touched. CAUSES  Mid-back strains occur when a force is placed on the muscles or  tendons that is greater than they can handle. Common causes of injury include:  Ongoing overuse of the muscle-tendon units in the middle back, usually from incorrect body posture.  A single violent injury or force applied to the back. RISK INCREASES WITH:  Sports that involve twisting forces on the spine or a lot of bending at the waist (football, rugby, weightlifting, bowling, golf, tennis, speed skating, racquetball, swimming, running, gymnastics, diving).  Poor strength and flexibility.  Failure to warm up properly before activity.  Family history of low back pain or disk disorders.  Previous back injury or surgery (especially fusion). PREVENTION  Learn and use proper sports technique.  Warm up and stretch properly before activity.  Allow for adequate recovery between workouts.  Maintain physical fitness:  Strength, flexibility, and endurance.  Cardiovascular fitness. PROGNOSIS  If treated properly, mid-back strains usually heal within 6 weeks. RELATED COMPLICATIONS   Frequently recurring symptoms, resulting in a chronic problem. Properly treating the problem the first time decreases frequency of recurrence.  Chronic inflammation, scarring, and partial muscle-tendon tear.  Delayed healing or resolution of symptoms, especially if activity is resumed too soon.  Prolonged disability. TREATMENT Treatment first involves the use of ice and medicine, to reduce pain and inflammation. As the pain begins to subside, you may begin strengthening and stretching exercises to improve body posture and sport technique. These exercises may be performed at home or with a therapist. Severe injuries may require referral to a therapist for further evaluation and treatment, such as ultrasound.  Corticosteroid injections may be given to help reduce inflammation. Biofeedback (watching monitors of your body processes) and psychotherapy may also be prescribed. Prolonged bed rest is felt to do more  harm than good. Massage may help break the muscle spasms. Sometimes, an injection of cortisone, with or without local anesthetics, may be given to help relieve the pain and spasms. MEDICATION   If pain medicine is needed, nonsteroidal anti-inflammatory medicines (aspirin and ibuprofen), or other minor pain relievers (acetaminophen), are often advised.  Do not take pain medicine for 7 days before surgery.  Prescription pain relievers may be given, if your caregiver thinks they are needed. Use only as directed and only as much as you need.  Ointments applied to the skin may be helpful.  Corticosteroid injections may be given by your caregiver. These injections should be reserved for the most serious cases, because they may only be given a certain number of times. HEAT AND COLD:   Cold treatment (icing) should be applied for 10 to 15 minutes every 2 to 3 hours for inflammation and pain, and immediately after activity that aggravates your symptoms. Use ice packs or an ice massage.  Heat treatment may be used before performing stretching and strengthening activities prescribed by your caregiver, physical therapist, or athletic trainer. Use a heat pack or a warm water soak. SEEK IMMEDIATE MEDICAL CARE IF:  Symptoms get worse or do not improve in 2 to 4 weeks, despite treatment.  You develop numbness, weakness, or loss of bowel or bladder function.  New, unexplained symptoms develop. (Drugs used in treatment may produce side effects.) EXERCISES RANGE OF MOTION (ROM) AND STRETCHING EXERCISES - Mid-Back Strain These exercises may help you when beginning to rehabilitate your injury. In order to successfully resolve your symptoms, you must improve your posture. These exercises are designed to help reduce the forward-head and rounded-shoulder posture which contributes to this condition. Your symptoms may resolve with or without further involvement from your physician, physical therapist or athletic  trainer. While completing these exercises, remember:   Restoring tissue flexibility helps normal motion to return to the joints. This allows healthier, less painful movement and activity.  An effective stretch should be held for at least 30 seconds.  A stretch should never be painful. You should only feel a gentle lengthening or release in the stretched tissue. STRETCH - Axial Extension  Stand or sit on a firm surface. Assume a good posture: chest up, shoulders drawn back, stomach muscles slightly tense, knees unlocked (if standing) and feet hip width apart.  Slowly retract your chin, so your head slides back and your chin slightly lowers. Continue to look straight ahead.  You should feel a gentle stretch in the back of your head. Be certain not to feel an aggressive stretch since this can cause headaches later.  Hold for __________ seconds. Repeat __________ times. Complete this exercise __________ times per day. RANGE OF MOTION- Upper Thoracic Extension  Sit on a firm chair with a high back. Assume a good posture: chest up, shoulders drawn back, abdominal muscles slightly tense, and feet hip width apart. Place a small pillow or folded towel in the curve of your lower back, if you are having difficulty maintaining good posture.  Gently brace your neck with your hands, allowing your arms to rest on your chest.  Continue to support your neck and slowly extend your back over the chair. You will feel a stretch across your upper back.  Hold __________ seconds. Slowly return to the  starting position. Repeat __________ times. Complete this exercise __________ times per day. RANGE OF MOTION- Mid-Thoracic Extension  Roll a towel so that it is about 4 inches in diameter.  Position the towel lengthwise. Lay on the towel so that your spine, but not your shoulder blades, are supported.  You should feel your mid-back arching toward the floor. To increase the stretch, extend your arms away from  your body.  Hold for __________ seconds. Repeat exercise __________ times, __________ times per day. STRENGTHENING EXERCISES - Mid-Back Strain These exercises may help you when beginning to rehabilitate your injury. They may resolve your symptoms with or without further involvement from your physician, physical therapist or athletic trainer. While completing these exercises, remember:   Muscles can gain both the endurance and the strength needed for everyday activities through controlled exercises.  Complete these exercises as instructed by your physician, physical therapist or athletic trainer. Increase the resistance and repetitions only as guided by your caregiver.  You may experience muscle soreness or fatigue, but the pain or discomfort you are trying to eliminate should never worsen during these exercises. If this pain does worsen, stop and make certain you are following the directions exactly. If the pain is still present after adjustments, discontinue the exercise until you can discuss the trouble with your caregiver. STRENGTHENING - Quadruped, Opposite UE/LE Lift  Assume a hands and knees position on a firm surface. Keep your hands under your shoulders and your knees under your hips. You may place padding under your knees for comfort.  Find your neutral spine and gently tense your abdominal muscles so that you can maintain this position. Your shoulders and hips should form a rectangle that is parallel with the floor and is not twisted.  Keeping your trunk steady, lift your right hand no higher than your shoulder and then your left leg no higher than your hip. Make sure you are not holding your breath. Hold this position __________ seconds.  Continuing to keep your abdominal muscles tense and your back steady, slowly return to your starting position. Repeat with the opposite arm and leg. Repeat __________ times. Complete this exercise __________ times per day.  STRENGTH - Shoulder  Extensors  Secure a rubber exercise band or tubing to a fixed object (table, pole) so that it is at the height of your shoulders when you are either standing, or sitting on a firm armless chair.  With a thumbs-up grip, grasp an end of the band in each hand. Straighten your elbows and lift your hands straight in front of you at shoulder height. Step back away from the secured end of band, until it becomes tense.  Squeezing your shoulder blades together, pull your hands down to the sides of your thighs. Do not allow your hands to go behind you.  Hold for __________ seconds. Slowly ease the tension on the band, as you reverse the directions and return to the starting position. Repeat __________ times. Complete this exercise __________ times per day.  STRENGTH - Horizontal Abductors Choose one of the two positions to complete this exercise. Prone: lying on stomach:  Lie on your stomach on a firm surface so that your right / left arm overhangs the edge. Rest your forehead on your opposite forearm. With your palm facing the floor and your elbow straight, hold a __________ weight in your hand.  Squeeze your right / left shoulder blade to your mid-back spine and then slowly raise your arm to the height of the bed.  Hold for __________ seconds. Slowly reverse the directions and return to the starting position, controlling the weight as you lower your arm. Repeat __________ times. Complete this exercise __________ times per day. Standing:   Secure a rubber exercise band or tubing, so that it is at the height of your shoulders when you are either standing, or sitting on a firm armless chair.  Grasp an end of the band in each hand and have your palms face each other. Straighten your elbows and lift your hands straight in front of you at shoulder height. Step back away from the secured end of band, until it becomes tense.  Squeeze your shoulder blades together. Keeping your elbows locked and your hands  at shoulder height, spread your arms apart, forming a "T" shape with your body. Hold __________ seconds. Slowly ease the tension on the band, as you reverse the directions and return to the starting position. Repeat __________ times. Complete this exercise __________ times per day. STRENGTH - Scapular Retractors and External Rotators, Rowing  Secure a rubber exercise band or tubing, so that it is at the height of your shoulders when you are either standing, or sitting on a firm armless chair.  With a palm-down grip, grasp an end of the band in each hand. Straighten your elbows and lift your hands straight in front of you at shoulder height. Step back away from the secured end of band, until it becomes tense.  Step 1: Squeeze your shoulder blades together. Bending your elbows, draw your hands to your chest as if you are rowing a boat. At the end of this motion, your hands and elbow should be at shoulder height and your elbows should be out to your sides.  Step 2: Rotate your shoulder to raise your hands above your head. Your forearms should be vertical and your upper arms should be horizontal.  Hold for __________ seconds. Slowly ease the tension on the band, as you reverse the directions and return to the starting position. Repeat __________ times. Complete this exercise __________ times per day.  POSTURE AND BODY MECHANICS CONSIDERATIONS - Mid-Back Strain Keeping correct posture when sitting, standing or completing your activities will reduce the stress put on different body tissues, allowing injured tissues a chance to heal and limiting painful experiences. The following are general guidelines for improved posture. Your physician or physical therapist will provide you with any instructions specific to your needs. While reading these guidelines, remember:  The exercises prescribed by your provider will help you have the flexibility and strength to maintain correct postures.  The correct posture  provides the best environment for your joints to work. All of your joints have less wear and tear when properly supported by a spine with good posture. This means you will experience a healthier, less painful body.  Correct posture must be practiced with all of your activities, especially prolonged sitting and standing. Correct posture is as important when doing repetitive low-stress activities (typing) as it is when doing a single heavy-load activity (lifting). PROPER SITTING POSTURE In order to minimize stress and discomfort on your spine, you must sit with correct posture. Sitting with good posture should be effortless for a healthy body. Returning to good posture is a gradual process. Many people can work toward this most comfortably by using various supports until they have the flexibility and strength to maintain this posture on their own. When sitting with proper posture, your ears will fall over your shoulders and your shoulders will fall  over your hips. You should use the back of the chair to support your upper back. Your lower back will be in a neutral position, just slightly arched. You may place a small pillow or folded towel at the base of your low back for  support.  When working at a desk, create an environment that supports good, upright posture. Without extra support, muscles fatigue and lead to excessive strain on joints and other tissues. Keep these recommendations in mind: CHAIR:  A chair should be able to slide under your desk when your back makes contact with the back of the chair. This allows you to work closely.  The chair's height should allow your eyes to be level with the upper part of your monitor and your hands to be slightly lower than your elbows. BODY POSITION  Your feet should make contact with the floor. If this is not possible, use a foot rest.  Keep your ears over your shoulders. This will reduce stress on your neck and lower back. INCORRECT SITTING POSTURES If  you are feeling tired and unable to assume a healthy sitting posture, do not slouch or slump. This puts excessive strain on your back tissues, causing more damage and pain. Healthier options include:  Using more support, like a lumbar pillow.  Switching tasks to something that requires you to be upright or walking.  Talking a brief walk.  Lying down to rest in a neutral-spine position. CORRECT STANDING POSTURES Proper standing posture should be assumed with all daily activities, even if they only take a few moments, like when brushing your teeth. As in sitting, your ears should fall over your shoulders and your shoulders should fall over your hips. You should keep a slight tension in your abdominal muscles to brace your spine. Your tailbone should point down to the ground, not behind your body, resulting in an over-extended swayback posture.  INCORRECT STANDING POSTURES Common incorrect standing postures include a forward head, locked knees, and an excessive swayback. WALKING Walk with an upright posture. Your ears, shoulders and hips should all line-up. CORRECT LIFTING TECHNIQUES DO :   Assume a wide stance. This will provide you more stability and the opportunity to get as close as possible to the object which you are lifting.  Tense your abdominals to brace your spine. Bend at the knees and hips. Keeping your back locked in a neutral-spine position, lift using your leg muscles. Lift with your legs, keeping your back straight.  Test the weight of unknown objects before attempting to lift them.  Try to keep your elbows locked down at your sides in order get the best strength from your shoulders when carrying an object.  Always ask for help when lifting heavy or awkward objects. INCORRECT LIFTING TECHNIQUES DO NOT:   Lock your knees when lifting, even if it is a small object.  Bend and twist. Pivot at your feet or move your feet when needing to change directions.  Assume that you  can safely pick up even a paperclip without proper posture. Document Released: 09/14/2005 Document Revised: 01/29/2014 Document Reviewed: 12/27/2008 [facility] Patient Information 2015 [place], [place]. This information is not intended to replace advice given to you by your health care provider. Make sure you discuss any questions you have with your health care provider.   Emergency Department Resource Guide 1) Find a Doctor and Pay Out of Pocket Although you won't have to find out who is covered by your insurance plan, it is a good idea  to ask around and get recommendations. You will then need to call the office and see if the doctor you have chosen will accept you as a new patient and what types of options they offer for patients who are self-pay. Some doctors offer discounts or will set up payment plans for their patients who do not have insurance, but you will need to ask so you aren't surprised when you get to your appointment.  2) Contact Your Local Health Department Not all health departments have doctors that can see patients for sick visits, but many do, so it is worth a call to see if yours does. If you don't know where your local health department is, you can check in your phone book. The CDC also has a tool to help you locate your state's health department, and many state websites also have listings of all of their local health departments.  3) Find a Walk-in Clinic If your illness is not likely to be very severe or complicated, you may want to try a walk in clinic. These are popping up all over the country in pharmacies, drugstores, and shopping centers. They're usually staffed by nurse practitioners or physician assistants that have been trained to treat common illnesses and complaints. They're usually fairly quick and inexpensive. However, if you have serious medical issues or chronic medical problems, these are probably not your best option.  No Primary Care Doctor: - Call [facility]  at  [phone] - they can help you locate a primary care doctor that  accepts your insurance, provides certain services, etc. - Physician Referral Service- [phone]  Chronic Pain Problems: Organization         Address  Phone   Notes  [facility]  [phone] Patients need to be referred by their primary care doctor.   Medication Assistance: Organization         Address  Phone   Notes  [facility] Medication [facility] [address] [place] [place], [place] [address] [phone] --Must be a resident of [facility] -- Must have NO insurance coverage whatsoever (no Medicaid/ Medicare, etc.) -- The pt. MUST have a primary care doctor that directs their care regularly and follows them in the community   [facility]  [phone]   [employer]  [phone]    Agencies that provide inexpensive medical care: Organization         Address  Phone   Notes  [facility]  [phone]   [facility]    [phone]   [facility] [address] [place], [place] [address] [phone]   [facility] [address] [place] [phone]   [facility]anned Parenthood    [phone]   [facility]    [phone]   [facility]  [address], [place] Phone:  [phone], Fax:  [phone] Hours of Operation:  9 am - 6 pm, M-F.  Also accepts Medicaid/Medicare and self-pay.  [facility]  [address],  Phone: [phone], Fax: [phone]. Hours of Operation:  8:30 am - 5:30 pm, M-F.  Also accepts Medicaid and self-pay.  [facility] [place] [address] [place] Phone: [phone]   [facility] [address] Natasha Bence [place], [place] [phone], Ext. [phone] Mondays & Thursdays: 7-9 AM.  First 15 patients are  seen on a first come, first serve basis.     Medicaid-accepting [facility] Providers:  Organization         Address  Phone   Notes  [facility] [address], [place] [phone] Also accepts self-pay patients.  [facility] [address] Laurell Josephs [place], [place]  [phone]   [facility] [address], [place] [phone]   [facility] [address], [place] [phone]   Renaye Rakers [address], [place]   [phone] Only accepts [place] patients after they have their name applied to their card.   Self-Pay (no insurance) in [facility]:  Organization         Address  Phone   Notes  Sickle Cell Patients, [facility] [address] [place], [place] [phone]   [facility] [address] [place], [place] [phone]   [facility] [place]  [address], [place] [phone]   [facility]/Dr. Osei-Bonsu  [address] [place] or [address], [place] [phone] Phone number for both [place] and [place] locations is the same.  [facility] [address] [place] [phone]   [facility] [address], [place] or [address] [phone]   [facility] [address] [place] [phone], phone; [phone], fax Sees patients 1st and 3rd Saturday of every month.  Must not qualify for public or private insurance (i.e. Medicaid, Medicare, [place] Health Choice, Veterans' Benefits)  Household income should be no more than 200% of the poverty level The clinic cannot treat you if you are pregnant or think you are pregnant  Sexually transmitted diseases are not treated at the clinic.    Dental Care: Organization         Address  Phone  Notes  [facility]  Department of [facility] [address] [place], [place] [phone] Accepts children up to age 18 who are enrolled in [place] or [place] Health Choice; pregnant women with a Medicaid card; and children who have applied for Medicaid or [place] Health Choice, but were declined, whose parents can pay a reduced fee at time of service.  [facility] Department of [facility]  [address], [place] [phone] Accepts children up to age 22 who are enrolled in [place] or [place] Health Choice; pregnant women with a Medicaid card; and children who have applied for Medicaid or [place] Health Choice, but were declined, whose parents can pay a reduced fee at time of service.  [facility]  [address] [place], [place] [phone] Patients are seen by appointment only. Walk-ins are not accepted. [facility] will see patients 69 years of age and older. Monday - Tuesday (8am-5pm) Most Wednesdays (8:30-5pm) $30 per visit, cash only  [facility]  [address], [facility] [phone] Patients are seen by appointment only. Walk-ins are not accepted. [facility] will see patients 54 years of age and older. One Wednesday Evening (Monthly: Volunteer Based).  $30 per visit, cash only  [employer]  765-711-9616 for adults; Children under age 28, call Graduate Pediatric Dentistry at [phone])  [phone]. Children aged 24-14, please call [phone] to request a pediatric application.  Dental services are provided in all areas of dental care including fillings, crowns and bridges, complete and partial dentures, implants, gum treatment, root canals, and extractions. Preventive care is also provided. Treatment is provided to both adults and children. Patients are selected via a lottery and there is often a waiting list.   [facility] [address] [place]  [phone]  [email or web address]   [facility] [address] [place], [place] [phone], Ext. 123 Second and Fourth Thursday of each month, opens at 6:30 AM; Clinic ends at 9 AM.  Patients are seen on a first-come first-served basis, and a limited number are seen during each clinic.   [facility]  [address] [place]ins [place], [place] [phone]   Eligibility Requirements You must have lived in [place], [place], or [place] counties for at least the last three months.   You cannot be eligible for state or federal sponsored [place], including [employer], [place], or [employer].   You generally cannot be eligible for healthcare insurance through your employer.    How to apply: Eligibility screenings are held every Tuesday and Wednesday afternoon from 1:00 pm until 4:00 pm. You do not need an appointment for the interview!  [facility] [address] [place], [place] [phone]   [facility]  [phone]   [facility] Health Department  [phone]   [facility] Department  [phone]    Behavioral Health Resources in the Community: Intensive Outpatient Programs Organization         Address  Phone  Notes  [facility] Services [address] [place], [place] [phone]   [facility] Outpatient [address], [place], [place] [phone]   [facility] [address], [place], [place]  [phone]   [facility] Mental Health [address],  [place], [place] [phone] or 6787172453   Substance Abuse Resources Organization         Address  Phone  Notes  [facility]  720-724-1634   [facility]  [phone])[phone]   [facility]  267-404-1418   Floydene Flock  [phone]   Residential & Outpatient Substance Abuse Program  419-715-8176   Psychological Services Organization          Address  Phone  Notes  [facility]  [phone]   [facility] Services  [phone]   [facility] Mental Health [address] [place] [phone] or [phone]    Mobile Crisis Teams Organization         Address  Phone  Notes  [facility], Mobile Crisis Care Unit  [phone]   [facility]  [address] [place], [place] [phone]   Doristine Locks [address] [place] [phone]    Self-Help/Support Groups Organization         Address  Phone             Notes  [facility]oc[facility] of  - variety of support groups  336- I7437963 Call for more information  Narcotics Anonymous (NA), [facility] [address], [employer] [place]  2 meetings at this location   Chief Executive Officer  Notes  [facility] [address],    [place]  [phone]   [facility]  [address], [place] [address], [place], [place] [phone]   [facility] [address] [place], [place] [phone] Admissions: 8am-3pm M-F  [facility] [address],    [place], [place] [phone]   [facility] [address] [place], [place], [place] [phone]   [facility] [address]  [place], [place] [phone]   [facility] - Intensive Outpatient [address], [place], [place] [phone]   [facility] ([facility] [address] [place].,  [place], [place] [phone] or [phone]   [facility] ([facility]) [address] [place], [place] [phone] Accepts Medicaid  [facility] [address],  [place] [phone] Substance Abuse/Addiction Treatment   [facility] Organization         Address  Phone  Notes  [facility]  [phone]   Angie Fava, PhD [address] Ervin Knack [place], [place]   [phone] or (938)[phone]   [facility]al   [address] [place], [place] [phone])[phone]   [facility] [address] [place], [place] [phone] Insurance/Medicaid/sponsorship through [facility] [address]                                    [place], [place] [phone] Therapy/tele-psych/case  [facility] [address], [place] [phone]    Dr. Lolly Mustache  [phone]) [phone]   [facility]  [employer] [facility] Dept. 1) [address], [place] 2) [address], [place] 3)  [address], [place] [phone]  [phone]   [facility] Child Abuse Hotline 207-346-0091 or 620-291-1696 (After Hours)

## 2015-01-18 NOTE — ED Notes (Signed)
Pt c/o mid back pain after lifting today

## 2015-09-26 ENCOUNTER — Encounter (HOSPITAL_COMMUNITY): Payer: Self-pay | Admitting: Emergency Medicine

## 2015-09-26 ENCOUNTER — Emergency Department (HOSPITAL_COMMUNITY)
Admission: EM | Admit: 2015-09-26 | Discharge: 2015-09-27 | Disposition: A | Discharge status: Home / Self Care | Payer: Medicaid Other | Attending: Emergency Medicine | Admitting: Emergency Medicine

## 2015-09-26 DIAGNOSIS — Z87891 Personal history of nicotine dependence: Secondary | ICD-10-CM | POA: Insufficient documentation

## 2015-09-26 DIAGNOSIS — O209 Hemorrhage in early pregnancy, unspecified: Secondary | ICD-10-CM

## 2015-09-26 DIAGNOSIS — R531 Weakness: Secondary | ICD-10-CM | POA: Insufficient documentation

## 2015-09-26 DIAGNOSIS — Z3A01 Less than 8 weeks gestation of pregnancy: Secondary | ICD-10-CM | POA: Insufficient documentation

## 2015-09-26 DIAGNOSIS — R5383 Other fatigue: Secondary | ICD-10-CM | POA: Insufficient documentation

## 2015-09-26 DIAGNOSIS — R109 Unspecified abdominal pain: Secondary | ICD-10-CM | POA: Insufficient documentation

## 2015-09-26 DIAGNOSIS — R630 Anorexia: Secondary | ICD-10-CM | POA: Insufficient documentation

## 2015-09-26 DIAGNOSIS — O200 Threatened abortion: Secondary | ICD-10-CM

## 2015-09-26 DIAGNOSIS — R55 Syncope and collapse: Secondary | ICD-10-CM | POA: Insufficient documentation

## 2015-09-26 DIAGNOSIS — O9989 Other specified diseases and conditions complicating pregnancy, childbirth and the puerperium: Secondary | ICD-10-CM | POA: Insufficient documentation

## 2015-09-26 LAB — BASIC METABOLIC PANEL
ANION GAP: 10 (ref 5–15)
BUN: 6 mg/dL (ref 6–20)
CHLORIDE: 105 mmol/L (ref 101–111)
CO2: 20 mmol/L — ABNORMAL LOW (ref 22–32)
Calcium: 9.3 mg/dL (ref 8.9–10.3)
Creatinine, Ser: 0.65 mg/dL (ref 0.44–1.00)
GFR calc Af Amer: >60 mL/min (ref >60)
Glucose, Bld: 94 mg/dL (ref 65–99)
POTASSIUM: 3.7 mmol/L (ref 3.5–5.1)
SODIUM: 135 mmol/L (ref 135–145)

## 2015-09-26 LAB — URINALYSIS, ROUTINE W REFLEX MICROSCOPIC
Bilirubin Urine: NEGATIVE
GLUCOSE, UA: NEGATIVE mg/dL
KETONES UR: NEGATIVE mg/dL
NITRITE: POSITIVE — AB
PH: 5 (ref 5.0–8.0)
Protein, ur: NEGATIVE mg/dL
SPECIFIC GRAVITY, URINE: 1.021 (ref 1.005–1.030)

## 2015-09-26 LAB — URINE MICROSCOPIC-ADD ON

## 2015-09-26 LAB — CBC
HEMATOCRIT: 35.9 % — AB (ref 36.0–46.0)
HEMOGLOBIN: 12 g/dL (ref 12.0–15.0)
MCH: 30.5 pg (ref 26.0–34.0)
MCHC: 33.4 g/dL (ref 30.0–36.0)
MCV: 91.3 fL (ref 78.0–100.0)
Platelets: 197 10*3/uL (ref 150–400)
RBC: 3.93 MIL/uL (ref 3.87–5.11)
RDW: 13 % (ref 11.5–15.5)
WBC: 5.6 10*3/uL (ref 4.0–10.5)

## 2015-09-26 LAB — SAMPLE TO BLOOD BANK

## 2015-09-26 LAB — CBG MONITORING, ED: Glucose-Capillary: 94 mg/dL (ref 65–99)

## 2015-09-26 LAB — POC URINE PREG, ED: PREG TEST UR: POSITIVE — AB

## 2015-09-26 NOTE — ED Notes (Signed)
CBG 94 

## 2015-09-26 NOTE — ED Notes (Signed)
Pt. reports brief syncopal episode/dizziness while at work this evening , denies injury , alert and oriented at arrival , pt. stated she had a positive home preg test with vaginal bleeding .

## 2015-09-27 ENCOUNTER — Emergency Department (HOSPITAL_COMMUNITY): Payer: Medicaid Other

## 2015-09-27 LAB — HCG, QUANTITATIVE, PREGNANCY: HCG, BETA CHAIN, QUANT, S: 27037 m[IU]/mL — AB (ref <5)

## 2015-09-27 NOTE — Discharge Instructions (Signed)
Follow-up with your obstetrician in the next week.   Near-Syncope Near-syncope (commonly known as near fainting) is sudden weakness, dizziness, or feeling like you might pass out. During an episode of near-syncope, you may also develop pale skin, have tunnel vision, or feel sick to your stomach (nauseous). Near-syncope may occur when getting up after sitting or while standing for a long time. It is caused by a sudden decrease in blood flow to the brain. This decrease can result from various causes or triggers, most of which are not serious. However, because near-syncope can sometimes be a sign of something serious, a medical evaluation is required. The specific cause is often not determined. HOME CARE INSTRUCTIONS  Monitor your condition for any changes. The following actions may help to alleviate any discomfort you are experiencing:  Have someone stay with you until you feel stable.  Lie down right away and prop your feet up if you start feeling like you might faint. Breathe deeply and steadily. Wait until all the symptoms have passed. Most of these episodes last only a few minutes. You may feel tired for several hours.   Drink enough fluids to keep your urine clear or pale yellow.   If you are taking blood pressure or heart medicine, get up slowly when seated or lying down. Take several minutes to sit and then stand. This can reduce dizziness.  Follow up with your health care provider as directed. SEEK IMMEDIATE MEDICAL CARE IF:   You have a severe headache.   You have unusual pain in the chest, abdomen, or back.   You are bleeding from the mouth or rectum, or you have black or tarry stool.   You have an irregular or very fast heartbeat.   You have repeated fainting or have seizure-like jerking during an episode.   You faint when sitting or lying down.   You have confusion.   You have difficulty walking.   You have severe weakness.   You have vision problems.   MAKE SURE YOU:   Understand these instructions.  Will watch your condition.  Will get help right away if you are not doing well or get worse.   This information is not intended to replace advice given to you by your health care provider. Make sure you discuss any questions you have with your health care provider.   Document Released: 09/14/2005 Document Revised: 09/19/2013 Document Reviewed: 02/17/2013 Elsevier Interactive Patient Education 2016 [employer]  Threatened Miscarriage A threatened miscarriage occurs when you have vaginal bleeding during your first 20 weeks of pregnancy but the pregnancy has not ended. If you have vaginal bleeding during this time, your health care provider will do tests to make sure you are still pregnant. If the tests show you are still pregnant and the developing baby (fetus) inside your womb (uterus) is still growing, your condition is considered a threatened miscarriage. A threatened miscarriage does not mean your pregnancy will end, but it does increase the risk of losing your pregnancy (complete miscarriage). CAUSES  The cause of a threatened miscarriage is usually not known. If you go on to have a complete miscarriage, the most common cause is an abnormal number of chromosomes in the developing baby. Chromosomes are the structures inside cells that hold all your genetic material. Some causes of vaginal bleeding that do not result in miscarriage include:  Having sex.  Having an infection.  Normal hormone changes of pregnancy.  Bleeding that occurs when an egg implants in your uterus.  RISK FACTORS Risk factors for bleeding in early pregnancy include:  Obesity.  Smoking.  Drinking excessive amounts of alcohol or caffeine.  Recreational drug use. SIGNS AND SYMPTOMS  Light vaginal bleeding.  Mild abdominal pain or cramps. DIAGNOSIS  If you have bleeding with or without abdominal pain before 20 weeks of pregnancy, your health care  provider will do tests to check whether you are still pregnant. One important test involves using sound waves and a computer (ultrasound) to create images of the inside of your uterus. Other tests include an internal exam of your vagina and uterus (pelvic exam) and measurement of your baby's heart rate.  You may be diagnosed with a threatened miscarriage if:  Ultrasound testing shows you are still pregnant.  Your baby's heart rate is strong.  A pelvic exam shows that the opening between your uterus and your vagina (cervix) is closed.  Your heart rate and blood pressure are stable.  Blood tests confirm you are still pregnant. TREATMENT  No treatments have been shown to prevent a threatened miscarriage from going on to a complete miscarriage. However, the right home care is important.  HOME CARE INSTRUCTIONS   Make sure you keep all your appointments for prenatal care. This is very important.  Get plenty of rest.  Do not have sex or use tampons if you have vaginal bleeding.  Do not douche.  Do not smoke or use recreational drugs.  Do not drink alcohol.  Avoid caffeine. SEEK MEDICAL CARE IF:  You have light vaginal bleeding or spotting while pregnant.  You have abdominal pain or cramping.  You have a fever. SEEK IMMEDIATE MEDICAL CARE IF:  You have heavy vaginal bleeding.  You have blood clots coming from your vagina.  You have severe low back pain or abdominal cramps.  You have fever, chills, and severe abdominal pain. MAKE SURE YOU:  Understand these instructions.  Will watch your condition.  Will get help right away if you are not doing well or get worse.   This information is not intended to replace advice given to you by your health care provider. Make sure you discuss any questions you have with your health care provider.   Document Released: 09/14/2005 Document Revised: 09/19/2013 Document Reviewed: 07/11/2013 Elsevier Interactive Patient Education 2016  [employer].  Emergency Department Resource Guide 1) Find a Doctor and Pay Out of Pocket Although you won't have to find out who is covered by your insurance plan, it is a good idea to ask around and get recommendations. You will then need to call the office and see if the doctor you have chosen will accept you as a new patient and what types of options they offer for patients who are self-pay. Some doctors offer discounts or will set up payment plans for their patients who do not have insurance, but you will need to ask so you aren't surprised when you get to your appointment.  2) Contact Your Local Health Department Not all health departments have doctors that can see patients for sick visits, but many do, so it is worth a call to see if yours does. If you don't know where your local health department is, you can check in your phone book. The CDC also has a tool to help you locate your state's health department, and many state websites also have listings of all of their local health departments.  3) Find a Walk-in Clinic If your illness is not likely to be very severe or complicated,  you may want to try a walk in clinic. These are popping up all over the country in pharmacies, drugstores, and shopping centers. They're usually staffed by nurse practitioners or physician assistants that have been trained to treat common illnesses and complaints. They're usually fairly quick and inexpensive. However, if you have serious medical issues or chronic medical problems, these are probably not your best option.  No Primary Care Doctor: - Call Health Connect at  [phone] - they can help you locate a primary care doctor that  accepts your insurance, provides certain services, etc. - Physician Referral Service- [phone]  Chronic Pain Problems: Organization         Address  Phone   Notes  [facility]  [phone] Patients need to be referred by their primary care doctor.    Medication Assistance: Organization         Address  Phone   Notes  [facility] Medication [facility] [address] [place], [place] [address] [phone] --Must be a resident of [facility] -- Must have NO insurance coverage whatsoever (no Medicaid/ Medicare, etc.) -- The pt. MUST have a primary care doctor that directs their care regularly and follows them in the community   MedAssist  [phone]   [employer]  [phone]    Agencies that provide inexpensive medical care: Organization         Address  Phone   Notes  [facility]  [phone]   [facility]    [phone]   [facility] [address] [place], [place] [address] [phone]   [facility] [address] [place] [phone]   Planned Parenthood    [phone]   [facility]    [phone]   [facility]  [address], [place] Phone:  [phone], Fax:  [phone] Hours of Operation:  9 am - 6 pm, M-F.  Also accepts Medicaid/Medicare and self-pay.  [facility]  [address], [place] Phone: [phone], Fax: [phone]. Hours of Operation:  8:30 am - 5:30 pm, M-F.  Also accepts Medicaid and self-pay.  [facility] [place] [address] [place] Phone: [phone]   [facility] [address] Natasha Bence [place], [place] [phone], Ext. [phone] Mondays & Thursdays: 7-9 AM.  First 15 patients are seen on a first come, first serve basis.    Medicaid-accepting [facility] Providers:  Organization         Address  Phone   Notes  [facility] [address], [place] [phone] Also accepts self-pay patients.  [facility] [address] Laurell Josephs [place], [place]  [phone]   [facility] [address], [place] [phone]   [facility] [address], [place] [phone]   Renaye Rakers [address], [place]   [phone] Only accepts [place] Access [place] patients after they have their name applied to their card.   Self-Pay (no insurance) in [facility]:  Organization         Address  Phone   Notes  Sickle Cell Patients, [facility] [address] [place], [place] [phone]   [facility] [address] [place],  [place] [phone]   [facility] [place]  [address],  [phone]   [facility]/Dr. Osei-Bonsu  [address] [place] or [address], [place] [phone] Phone number for both [place] and [place] locations is the same.  [facility] [address], [place] [phone]   [facility] [address], [place] or [address] [phone]   [facility] [address] [place] [phone], phone; [phone], fax Sees patients 1st and 3rd Saturday of every month.  Must not qualify for public or private insurance (i.e. Medicaid, Medicare, [place] Health Choice, Veterans' Benefits)  Household income should be no more than 200% of the poverty level The clinic cannot treat you if you are pregnant or think you are pregnant  Sexually transmitted diseases are not treated at the clinic.    Dental Care: Organization         Address  Phone  Notes  [facility] Department of [facility] [address] [place], [place] [phone] Accepts children up to age 100 who are enrolled in [place] or [place] Health Choice; pregnant women with a Medicaid card; and children who have applied for Medicaid or [place] Health Choice, but were declined, whose parents can pay a reduced fee at time of service.   [facility] Department of [facility]  [address], [place] [phone] Accepts children up to age 29 who are enrolled in [place] or [place] Health Choice; pregnant women with a Medicaid card; and children who have applied for Medicaid or [place] Health Choice, but were declined, whose parents can pay a reduced fee at time of service.  [facility]  [address] [place], [place] [phone] Patients are seen by appointment only. Walk-ins are not accepted. [facility] will see patients 63 years of age and older. Monday - Tuesday (8am-5pm) Most Wednesdays (8:30-5pm) $30 per visit, cash only  [facility] Adult Dental Access PROGRAM  [address], [facility] [phone] Patients are seen by appointment only. Walk-ins are not accepted. [facility] will see patients 25 years of age and older. One Wednesday Evening (Monthly: Volunteer Based).  $30 per visit, cash only  [employer]  [phone] for adults; Children under age 48, call Graduate Pediatric Dentistry at [phone]. Children aged 3-14, please call [phone] to request a pediatric application.  Dental services are provided in all areas of dental care including fillings, crowns and bridges, complete and partial dentures, implants, gum treatment, root canals, and extractions. Preventive care is also provided. Treatment is provided to both adults and children. Patients are selected via a lottery and there is often a waiting list.   [facility] [address], [place]  [phone] [email or web address]   [facility] [address] [place], [place] [phone], Ext. [phone] Second and Fourth Thursday of each month, opens at 6:30 AM; Clinic ends at 9 AM.  Patients are seen on a first-come first-served basis, and a limited number are seen during each clinic.   [facility]  [address] [place] [employer], [place] [phone]   Eligibility Requirements You must have lived in [place], [place], or [place] counties for at least the last three months.   You cannot be eligible  for state or [employer], including [employer], [place], or [employer].   You generally cannot be eligible for healthcare insurance through your employer.    How to apply: Eligibility screenings are held every Tuesday and Wednesday afternoon from 1:00 pm until 4:00 pm. You do not need an appointment for the interview!  [facility] [address], [place], [place] [phone]   [facility] Health Department  [phone]   [facility] Department  [phone]   [facility] Health Department  [phone]    Behavioral Health Resources in the Community: Intensive Outpatient Programs Organization         Address  Phone  Notes  [facility] Services [address] [place], [place] [phone]   [facility] Outpatient [address], [place], [place] [phone]   [facility] [address] [place], [place]  [phone]   [facility] [address]  [place], [place] [phone] or [phone]   Substance Abuse Resources Organization         Address  Phone  Notes  [facility]  [phone]   [facility]  [phone]   [facility] [place]  [phone]   Floydene Flock  [phone]   Residential & Outpatient Substance Abuse Program  [phone]   Psychological Services Organization         Address  Phone  Notes  [facility] Behavioral Health  [phone]   [facility] Services  [phone]   [facility] Mental Health [address] [place] [phone] or [phone]    Mobile Crisis Teams Organization         Address  Phone  Notes  [facility], Mobile Crisis Care Unit  [phone]   [facility]  [address] [place], [place] [phone]   Doristine Locks [address] [place] [phone]    Self-Help/Support Groups Organization         Address  Phone             Notes  [facility] [place] - variety of support groups  336- I7437963 Call for more information  Narcotics Anonymous (NA), [facility] [address], [employer] [place]  2 meetings at this location   Statistician         Address  Phone  Notes  [facility] [address],    [place]  [phone]   [facility]  [address] [place] [address], [place], [place] [phone]   [facility] Treatment Facility [address] [place], [place] [phone] Admissions: 8am-3pm M-F  [facility] [address]    [place], [place] [phone]   [facility] [address] [place], [place] [phone]   [facility] [address]  [place], [place] [phone]   [facility] - Intensive Outpatient [address] Laurell Josephs [address], [place], [place] [phone]   [facility] ([facility] [address] [place]  [place], [place] [phone] or [phone]   [facility] ([facility]) [address] [place], [place] [phone] Accepts Medicaid  [facility] [address],  [place] [phone] Substance Abuse/Addiction Treatment   [facility] Organization         Address  Phone  Notes  [facility]  [phone]   Angie Fava, PhD [address] [place], [place]   [phone]  [phone] X3202989 or [phone]) [phone]   [facility]   [address] [place], [place] [phone]   [facility] [address] [place], [place] [phone] Insurance/Medicaid/sponsorship through [facility] [address]                                    [place], [place] [phone]  Therapy/tele-psych/case  [facility] [address]   [place], [place] [phone]    Dr. Lolly Mustache  [phone]   [facility]  [employer] [facility] 1) [address], [place] 2) [address], [place] 3)  [address], [place] [phone]  [phone]   [facility] Child Abuse Hotline [phone] or [phone] (After Hours)

## 2015-09-27 NOTE — ED Notes (Addendum)
Pt reports she passed out earlier this evening around 2200 "only a few seconds" while at work. She stated she hadnt been feeling well all day. She started having vaginal bleeding around 8 days ago and has not stopped bleeding since. She took a home pregnancy test and it was positive. A&OX4, ambulatory. LMP 08/07/15.

## 2015-09-27 NOTE — ED Provider Notes (Signed)
CSN: [id number]     Arrival date & time 09/26/15  2216 History  By signing my name below, I, Bethel BornBritney McCollum, attest that this documentation has been prepared under the direction and in the presence of Geoffery Lyonsouglas Petula Rotolo, MD. Electronically Signed: Bethel BornBritney McCollum, ED Scribe. 09/27/2015. 12:30 AM  Chief Complaint  Patient presents with  . Loss of Consciousness  . Vaginal Bleeding     The history is provided by the patient. No language interpreter was used.   [name] is a 29 y.o. female who presents to the Emergency Department complaining of a near syncopal episode today at work. She states that she was severely lightheaded and had to bend over and then sit down. Denies LOC stating "no, I remember everything". Pt notes that she felt fatigued and had decreased appetite all day. She had a positive home pregnancy test 2 weeks ago. Associated symptoms include abdominal cramping and vaginal bleeding for 8 days. The bleeding has been light and characterized as spotting but increased today. LNMP was 08/07/15. G2P1.  History reviewed. No pertinent past medical history. History reviewed. No pertinent past surgical history. No family history on file. Social History  Substance Use Topics  . Smoking status: Former Games developermoker  . Smokeless tobacco: None  . Alcohol Use: Yes   OB History    No data available     Review of Systems 10 Systems reviewed and all are negative for acute change except as noted in the HPI.  Allergies  Review of patient's allergies indicates no known allergies.  Home Medications   Prior to Admission medications   Medication Sig Start Date End Date Taking? Authorizing Provider  methocarbamol (ROBAXIN) 500 MG tablet Take 2 tablets (1,000 mg total) by mouth 4 (four) times daily as needed (Pain). Patient not taking: Reported on 09/27/2015 01/18/15   Joni ReiningNicole Pisciotta, PA-C  naproxen (NAPROSYN) 500 MG tablet Take 1 tablet (500 mg total) by mouth 2 (two) times daily with a  meal. Patient not taking: Reported on 09/27/2015 05/11/14   Garlon HatchetLisa M Sanders, PA-C  naproxen (NAPROSYN) 500 MG tablet Take 1 tablet (500 mg total) by mouth 2 (two) times daily. Patient not taking: Reported on 09/27/2015 01/01/15   Nada Boozerobyn M Hess, PA-C   BP 129/75 mmHg  Pulse 74  Temp(Src) 98.4 F (36.9 C) (Oral)  Resp 14  Ht 5\' 8"  (1.727 m)  Wt 261 lb (118.389 kg)  BMI 39.69 kg/m2  SpO2 98%  LMP 08/07/2015 Physical Exam  Constitutional: She is oriented to person, place, and time. She appears well-developed and well-nourished. No distress.  HENT:  Head: Normocephalic and atraumatic.  Eyes: EOM are normal.  Neck: Normal range of motion.  Cardiovascular: Normal rate, regular rhythm and normal heart sounds.   Pulmonary/Chest: Effort normal and breath sounds normal.  Abdominal: Soft. She exhibits no distension. There is no tenderness.  Musculoskeletal: Normal range of motion.  Neurological: She is alert and oriented to person, place, and time.  Skin: Skin is warm and dry.  Psychiatric: She has a normal mood and affect. Judgment normal.  Nursing note and vitals reviewed.   ED Course  Procedures (including critical care time) DIAGNOSTIC STUDIES: Oxygen Saturation is 98% on RA,  normal by my interpretation.    COORDINATION OF CARE: 12:27 AM Discussed treatment plan which includes lab work and US with pt at bedside and pt agreed to plan.   Labs Review Labs Reviewed  BASIC METABOLIC PANEL - Abnormal; Notable for the following:  CO2 20 (*)    All other components within normal limits  CBC - Abnormal; Notable for the following:    HCT 35.9 (*)    All other components within normal limits  URINALYSIS, ROUTINE W REFLEX MICROSCOPIC (NOT AT [facility]) - Abnormal; Notable for the following:    APPearance CLOUDY (*)    Hgb urine dipstick LARGE (*)    Nitrite POSITIVE (*)    Leukocytes, UA TRACE (*)    All other components within normal limits  URINE MICROSCOPIC-ADD ON - Abnormal; Notable  for the following:    Squamous Epithelial / LPF 6-30 (*)    Bacteria, UA MANY (*)    All other components within normal limits  POC URINE PREG, ED - Abnormal; Notable for the following:    Preg Test, Ur POSITIVE (*)    All other components within normal limits  HCG, QUANTITATIVE, PREGNANCY  CBG MONITORING, ED  SAMPLE TO BLOOD BANK    Imaging Review No results found. I have personally reviewed and evaluated these images and lab results as part of my medical decision-making.   EKG Interpretation   Date/Time:  Thursday September 26 2015 22:48:00 EST Ventricular Rate:  78 PR Interval:  172 QRS Duration: 88 QT Interval:  388 QTC Calculation: 442 R Axis:   17 Text Interpretation:  Normal sinus rhythm Normal ECG Confirmed by Bessie Boyte   MD, Kymberlie Brazeau ([id number]) on 09/27/2015 5:22:34 AM      MDM   Final diagnoses:  None    Patient is a 29 year old female who presents with complaints of weakness that occurred while at work this evening. She felt that she was going to pass out and had to sit down and rest. She denied any palpitations or other complaints during this episode. She does report that she has been having some vaginal bleeding over the past week. She reports a home pregnancy test was positive several weeks ago.  Her workup today reveals a normal EKG, essentially normal labs, and positive pregnancy test. Her beta hCG was elevated quantitatively as well. He went for an ultrasound which revealed an intrauterine pregnancy with a small subchorionic hemorrhage. She will be discharged with instructions to have vaginal rest, no heavy lifting, and follow-up with OB in the next week.  I personally performed the services described in this documentation, which was scribed in my presence. The recorded information has been reviewed and is accurate.       Geoffery Lyons, MD 09/27/15 [phone]

## 2015-11-27 ENCOUNTER — Encounter (HOSPITAL_COMMUNITY): Payer: Self-pay | Admitting: Emergency Medicine

## 2015-11-27 ENCOUNTER — Emergency Department (HOSPITAL_COMMUNITY)
Admission: EM | Admit: 2015-11-27 | Discharge: 2015-11-27 | Disposition: A | Discharge status: Home / Self Care | Payer: BLUE CROSS/BLUE SHIELD | Attending: Emergency Medicine | Admitting: Emergency Medicine

## 2015-11-27 DIAGNOSIS — Z23 Encounter for immunization: Secondary | ICD-10-CM | POA: Diagnosis not present

## 2015-11-27 DIAGNOSIS — Y9389 Activity, other specified: Secondary | ICD-10-CM | POA: Insufficient documentation

## 2015-11-27 DIAGNOSIS — Y9289 Other specified places as the place of occurrence of the external cause: Secondary | ICD-10-CM | POA: Diagnosis not present

## 2015-11-27 DIAGNOSIS — T2125XA Burn of second degree of buttock, initial encounter: Secondary | ICD-10-CM | POA: Diagnosis not present

## 2015-11-27 DIAGNOSIS — T2105XA Burn of unspecified degree of buttock, initial encounter: Secondary | ICD-10-CM | POA: Diagnosis present

## 2015-11-27 DIAGNOSIS — T24212A Burn of second degree of left thigh, initial encounter: Secondary | ICD-10-CM | POA: Insufficient documentation

## 2015-11-27 DIAGNOSIS — X12XXXA Contact with other hot fluids, initial encounter: Secondary | ICD-10-CM | POA: Insufficient documentation

## 2015-11-27 DIAGNOSIS — T25131A Burn of first degree of right toe(s) (nail), initial encounter: Secondary | ICD-10-CM | POA: Insufficient documentation

## 2015-11-27 DIAGNOSIS — Y998 Other external cause status: Secondary | ICD-10-CM | POA: Insufficient documentation

## 2015-11-27 DIAGNOSIS — Z87891 Personal history of nicotine dependence: Secondary | ICD-10-CM | POA: Diagnosis not present

## 2015-11-27 DIAGNOSIS — T2115XA Burn of first degree of buttock, initial encounter: Secondary | ICD-10-CM

## 2015-11-27 MED ORDER — OXYCODONE-ACETAMINOPHEN 5-325 MG PO TABS
2.0000 | ORAL_TABLET | Freq: Once | ORAL | Status: AC
  Administered 2015-11-27: 2 via ORAL
  Filled 2015-11-27: qty 2

## 2015-11-27 MED ORDER — NAPROXEN 500 MG PO TABS: 500.0000 mg | ORAL_TABLET | Freq: Two times a day (BID) | ORAL | Status: DC

## 2015-11-27 MED ORDER — SILVER SULFADIAZINE 1 % EX CREA: 1.0000 "application " | TOPICAL_CREAM | Freq: Every day | CUTANEOUS | Status: DC

## 2015-11-27 MED ORDER — TETANUS-DIPHTH-ACELL PERTUSSIS 5-2.5-18.5 LF-MCG/0.5 IM SUSP
0.5000 mL | Freq: Once | INTRAMUSCULAR | Status: AC
  Administered 2015-11-27: 0.5 mL via INTRAMUSCULAR
  Filled 2015-11-27: qty 0.5

## 2015-11-27 MED ORDER — OXYCODONE-ACETAMINOPHEN 5-325 MG PO TABS: 1.0000 | ORAL_TABLET | ORAL | Status: DC | PRN

## 2015-11-27 MED ORDER — SILVER SULFADIAZINE 1 % EX CREA
TOPICAL_CREAM | Freq: Once | CUTANEOUS | Status: AC
  Administered 2015-11-27: 16:00:00 via TOPICAL
  Filled 2015-11-27: qty 85
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
## 2015-11-27 NOTE — ED Notes (Signed)
Pain ease spray applied.

## 2015-11-27 NOTE — ED Notes (Signed)
Patient able to ambulate independently  

## 2015-11-27 NOTE — Discharge Instructions (Signed)
Take the prescribed medication as directed.  Recommend dressing changes 1-2 times daily.  Keep area clean with soap and warm water.  Monitor for signs of infection including increased redness, purulent drainage (pus), worsening swelling, high fever, etc. New blisters may appear-- do not pop them.  Allow them to pop on their own.  They will drain some clear fluid which is normal. Follow-up with your primary care physician. Return to the ED for new or worsening symptoms.  Burn Care Your skin is a natural barrier to infection. It is the largest organ of your body. Burns damage this natural protection. To help prevent infection, it is very important to follow your caregiver's instructions in the care of your burn. Burns are classified as:  First degree. There is only redness of the skin (erythema). No scarring is expected.  Second degree. There is blistering of the skin. Scarring may occur with deeper burns.  Third degree. All layers of the skin are injured, and scarring is expected. HOME CARE INSTRUCTIONS   Wash your hands well before changing your bandage.  Change your bandage as often as directed by your caregiver.  Remove the old bandage. If the bandage sticks, you may soak it off with cool, clean water.  Cleanse the burn thoroughly but gently with mild soap and water.  Pat the area dry with a clean, dry cloth.  Apply a thin layer of antibacterial cream to the burn.  Apply a clean bandage as instructed by your caregiver.  Keep the bandage as clean and dry as possible.  Elevate the affected area for the first 24 hours, then as instructed by your caregiver.  Only take over-the-counter or prescription medicines for pain, discomfort, or fever as directed by your caregiver. SEEK IMMEDIATE MEDICAL CARE IF:   You develop excessive pain.  You develop redness, tenderness, swelling, or red streaks near the burn.  The burned area develops yellowish-white fluid (pus) or a bad  smell.  You have a fever. MAKE SURE YOU:   Understand these instructions.  Will watch your condition.  Will get help right away if you are not doing well or get worse.   This information is not intended to replace advice given to you by your health care provider. Make sure you discuss any questions you have with your health care provider.   Document Released: 09/14/2005 Document Revised: 12/07/2011 Document Reviewed: 02/04/2011 Elsevier Interactive Patient Education [employer]
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
## 2015-12-03 ENCOUNTER — Emergency Department (HOSPITAL_COMMUNITY)
Admission: EM | Admit: 2015-12-03 | Discharge: 2015-12-03 | Disposition: A | Discharge status: Home / Self Care | Payer: BLUE CROSS/BLUE SHIELD | Source: Home / Self Care | Attending: Family Medicine | Admitting: Family Medicine

## 2015-12-03 ENCOUNTER — Encounter (HOSPITAL_COMMUNITY): Payer: Self-pay | Admitting: Emergency Medicine

## 2015-12-03 DIAGNOSIS — T24212D Burn of second degree of left thigh, subsequent encounter: Secondary | ICD-10-CM | POA: Diagnosis not present

## 2015-12-03 MED ORDER — SILVER SULFADIAZINE 1 % EX CREA
TOPICAL_CREAM | Freq: Once | CUTANEOUS | Status: AC
  Administered 2015-12-03: 18:00:00 via TOPICAL

## 2015-12-03 MED ORDER — OXYCODONE-ACETAMINOPHEN 5-325 MG PO TABS: 2.0000 | ORAL_TABLET | ORAL | Status: DC | PRN

## 2015-12-03 MED ORDER — GAUZE BANDAGES MISC: Status: DC
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
## 2015-12-03 NOTE — ED Notes (Signed)
Silvadene cream applied and wound covered with 4x4 and ABD pad
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
## 2016-04-03 ENCOUNTER — Other Ambulatory Visit (HOSPITAL_COMMUNITY)
Admission: RE | Admit: 2016-04-03 | Discharge: 2016-04-03 | Disposition: A | Discharge status: Home / Self Care | Payer: BLUE CROSS/BLUE SHIELD | Source: Ambulatory Visit | Attending: Obstetrics & Gynecology | Admitting: Obstetrics & Gynecology

## 2016-04-03 ENCOUNTER — Other Ambulatory Visit: Payer: Self-pay | Admitting: Obstetrics & Gynecology

## 2016-04-03 DIAGNOSIS — Z01419 Encounter for gynecological examination (general) (routine) without abnormal findings: Secondary | ICD-10-CM | POA: Insufficient documentation

## 2016-04-03 DIAGNOSIS — Z113 Encounter for screening for infections with a predominantly sexual mode of transmission: Secondary | ICD-10-CM | POA: Insufficient documentation

## 2016-04-06 LAB — CYTOLOGY - PAP

## 2016-06-14 ENCOUNTER — Ambulatory Visit (HOSPITAL_COMMUNITY)
Admission: EM | Admit: 2016-06-14 | Discharge: 2016-06-14 | Disposition: A | Discharge status: Home / Self Care | Payer: BLUE CROSS/BLUE SHIELD | Attending: Family Medicine | Admitting: Family Medicine

## 2016-06-14 ENCOUNTER — Encounter (HOSPITAL_COMMUNITY): Payer: Self-pay | Admitting: *Deleted

## 2016-06-14 DIAGNOSIS — A084 Viral intestinal infection, unspecified: Secondary | ICD-10-CM

## 2016-06-14 MED ORDER — ONDANSETRON HCL 4 MG PO TABS: 4.0000 mg | ORAL_TABLET | Freq: Four times a day (QID) | ORAL | 0 refills | Status: DC

## 2016-06-14 MED ORDER — ONDANSETRON 4 MG PO TBDP
8.0000 mg | ORAL_TABLET | Freq: Once | ORAL | Status: AC
  Administered 2016-06-14: 8 mg via ORAL

## 2016-06-14 MED ORDER — ONDANSETRON 4 MG PO TBDP
ORAL_TABLET | ORAL | Status: AC
  Filled 2016-06-14: qty 2

## 2016-06-14 NOTE — ED Provider Notes (Signed)
[facility]    CSN: [id number] Arrival date & time: 06/14/16  1216  First Provider Contact:  First MD Initiated Contact with Patient 06/14/16 1330        History   Chief Complaint Chief Complaint  Patient presents with  . Nausea    HPI [name] is a 30 y.o. female.   The history is provided by the patient.  Emesis  Severity:  Moderate Duration:  5 hours Number of daily episodes:  4 Quality:  Stomach contents Progression:  Unchanged Chronicity:  New Ineffective treatments:  None tried Associated symptoms: diarrhea   Associated symptoms: no abdominal pain, no cough and no fever   Risk factors: no sick contacts and no suspect food intake     History reviewed. No pertinent past medical history.  There are no active problems to display for this patient.   History reviewed. No pertinent surgical history.  OB History    No data available       Home Medications    Prior to Admission medications   Medication Sig Start Date End Date Taking? Authorizing Provider  Gauze Bandages MISC Apply to burn area after Silvadene treatment daily.     Disp stock qs for 10 dressing changes. 12/03/15   Hayden Rasmussenavid Mabe, NP  methocarbamol (ROBAXIN) 500 MG tablet Take 2 tablets (1,000 mg total) by mouth 4 (four) times daily as needed (Pain). Patient not taking: Reported on 09/27/2015 01/18/15   Joni ReiningNicole Pisciotta, PA-C  naproxen (NAPROSYN) 500 MG tablet Take 1 tablet (500 mg total) by mouth 2 (two) times daily with a meal. 11/27/15   Garlon HatchetLisa M Sanders, PA-C  oxyCODONE-acetaminophen (PERCOCET/ROXICET) 5-325 MG tablet Take 2 tablets by mouth every 4 (four) hours as needed for severe pain. 12/03/15   Hayden Rasmussenavid Mabe, NP  silver sulfADIAZINE (SILVADENE) 1 % cream Apply 1 application topically daily. 11/27/15   Garlon HatchetLisa M Sanders, PA-C    Family History History reviewed. No pertinent family history.  Social History Social History  Substance Use Topics  . Smoking status: Former Games developermoker  .  Smokeless tobacco: Not on file  . Alcohol use Yes     Comment: every day     Allergies   Review of patient's allergies indicates no known allergies.   Review of Systems Review of Systems  Constitutional: Negative for fever.  Respiratory: Negative for cough.   Gastrointestinal: Positive for diarrhea and vomiting. Negative for abdominal pain.     Physical Exam Triage Vital Signs ED Triage Vitals  Enc Vitals Group     BP 06/14/16 1315 148/89     Pulse Rate 06/14/16 1315 60     Resp 06/14/16 1315 16     Temp 06/14/16 1315 98.2 F (36.8 C)     Temp Source 06/14/16 1315 Oral     SpO2 06/14/16 1315 100 %     Weight --      Height --      Head Circumference --      Peak Flow --      Pain Score 06/14/16 1330 8     Pain Loc --      Pain Edu? --      Excl. in GC? --    No data found.   Updated Vital Signs BP 148/89 (BP Location: Left Arm)   Pulse 60   Temp 98.2 F (36.8 C) (Oral)   Resp 16   SpO2 100%   Visual Acuity Right Eye Distance:   Left Eye  Distance:   Bilateral Distance:    Right Eye Near:   Left Eye Near:    Bilateral Near:     Physical Exam   UC Treatments / Results  Labs (all labs ordered are listed, but only abnormal results are displayed) Labs Reviewed - No data to display  EKG  EKG Interpretation None       Radiology No results found.  Procedures Procedures (including critical care time)  Medications Ordered in UC Medications  ondansetron (ZOFRAN-ODT) disintegrating tablet 8 mg (not administered)     Initial Impression / Assessment and Plan / UC Course  I have reviewed the triage vital signs and the nursing notes.  Pertinent labs & imaging results that were available during my care of the patient were reviewed by me and considered in my medical decision making (see chart for details).  Clinical Course      Final Clinical Impressions(s) / UC Diagnoses   Final diagnoses:  None    New Prescriptions New Prescriptions    No medications on file     Linna Hoff, MD 06/14/16 1346

## 2016-06-14 NOTE — ED Triage Notes (Signed)
Pt  States   She  Woke up  This   Am   With    abd  Pain   -   Nausea    ,   Vomiting     -      And  Diarrhea

## 2016-06-14 NOTE — Discharge Instructions (Signed)
Clear liquid , bland diet tonight as tolerated, advance on mon as improved, use medicine as needed, return or see your doctor if any problems.  °

## 2017-12-27 IMAGING — US US OB COMP LESS 14 WK
1 series · 14 of 28 positions shown · non-contrast
Comparison: None.

CLINICAL DATA: 29-year-old pregnant female with vaginal bleeding

EXAM:
OBSTETRIC <14 WK US AND TRANSVAGINAL OB US
TECHNIQUE: Both transabdominal and transvaginal ultrasound examinations were
performed for complete evaluation of the gestation as well as the
maternal uterus, adnexal regions, and pelvic cul-de-sac.
Transvaginal technique was performed to assess early pregnancy.

[Series 1: us ob comp less 14 wk · 0.23mm/px · 14 of 55 slices shown]
[im 3/55]
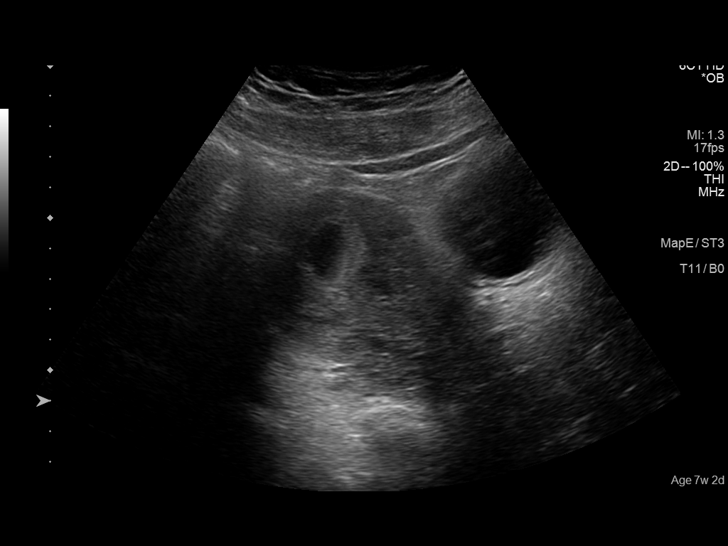
[im 7/55]
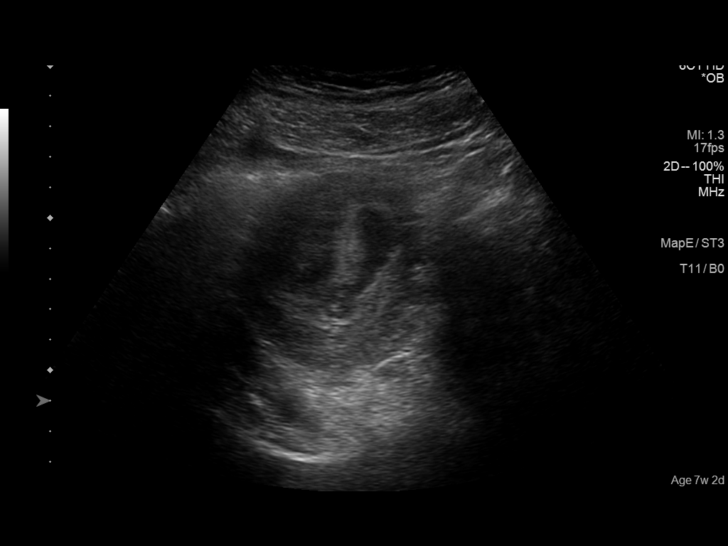
[im 11/55]
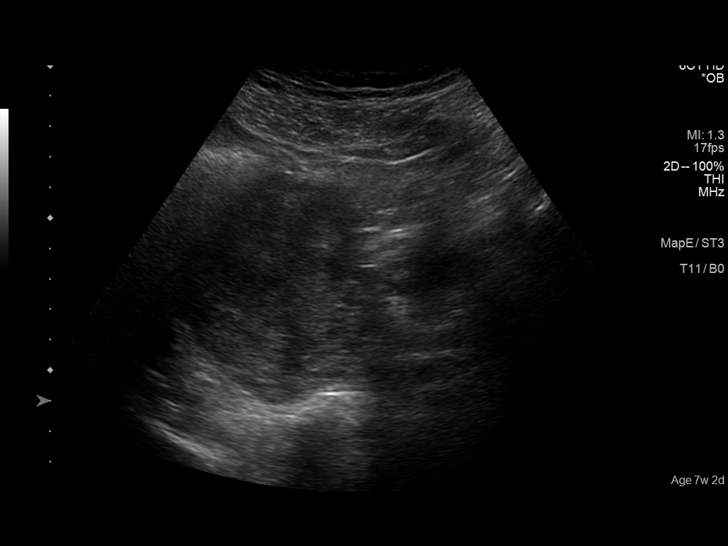
[im 15/55]
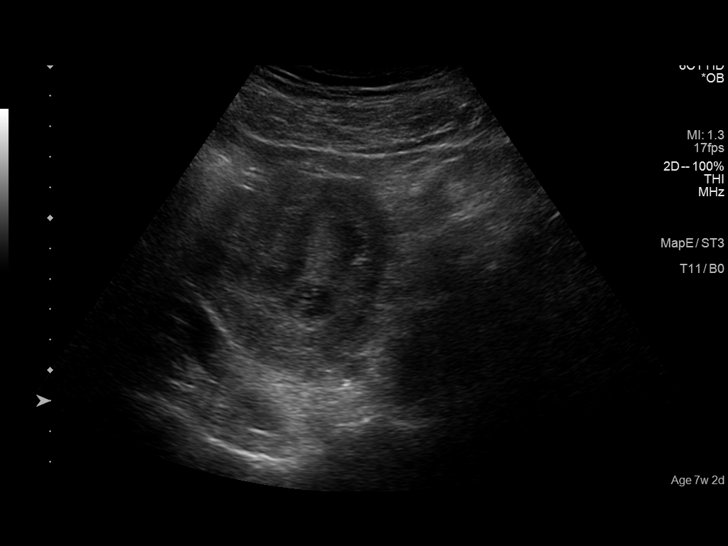
[im 19/55]
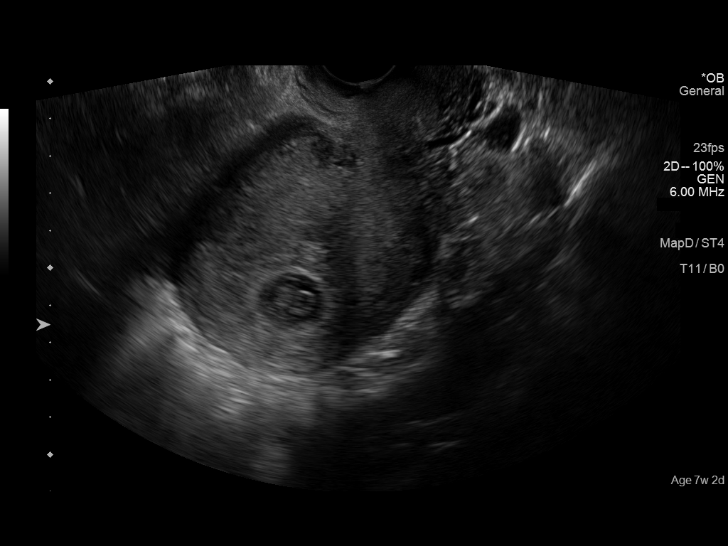
[im 23/55]
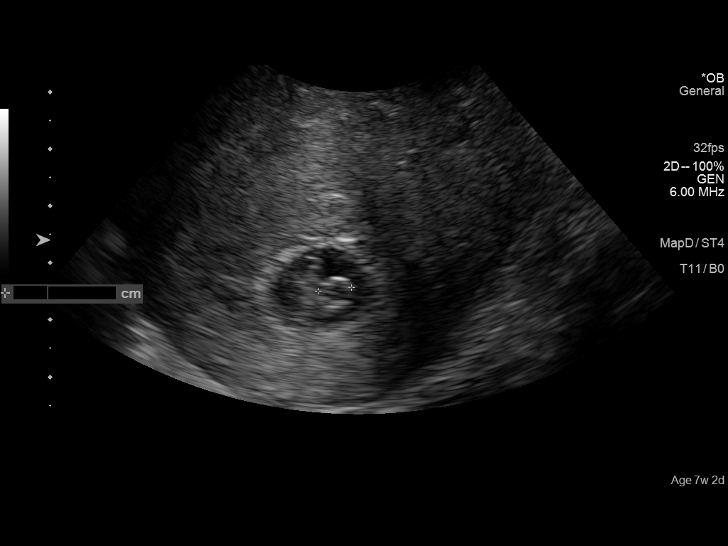
[im 27/55]
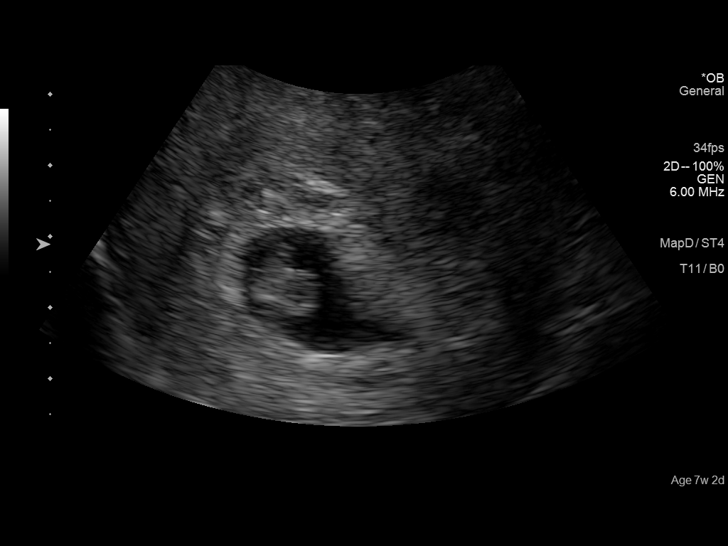
[im 31/55]
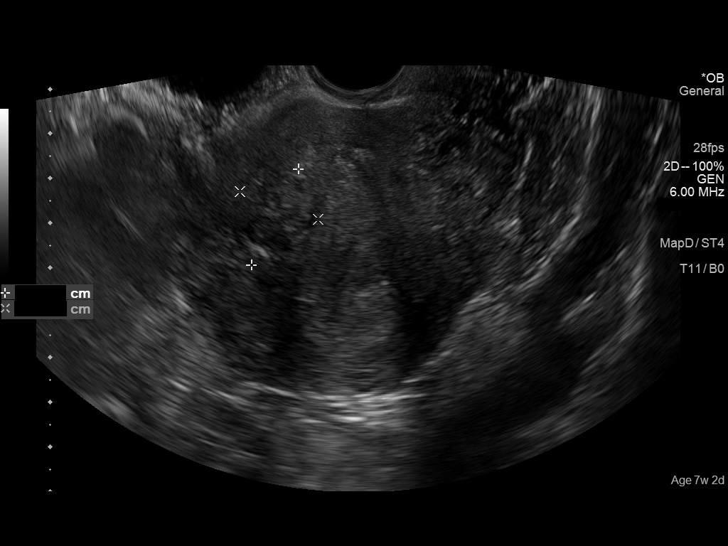
[im 35/55]
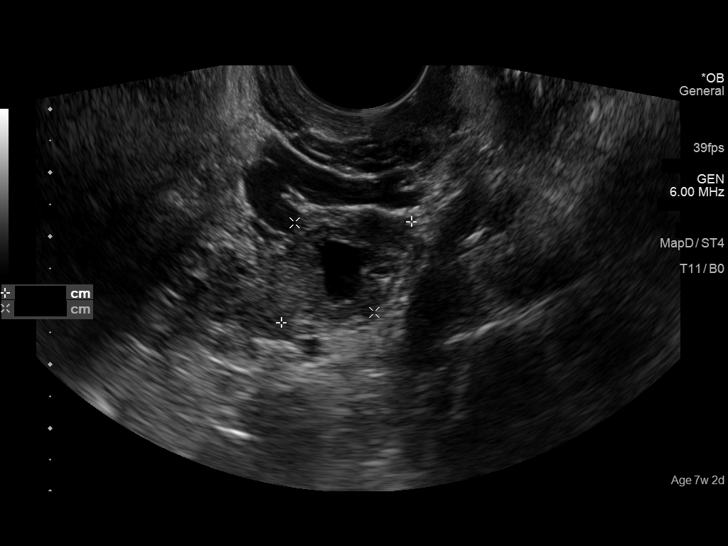
[im 39/55]
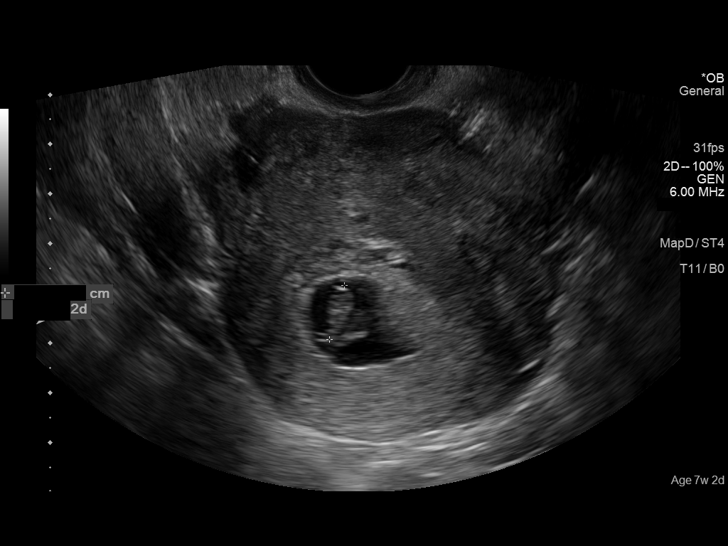
[im 43/55]
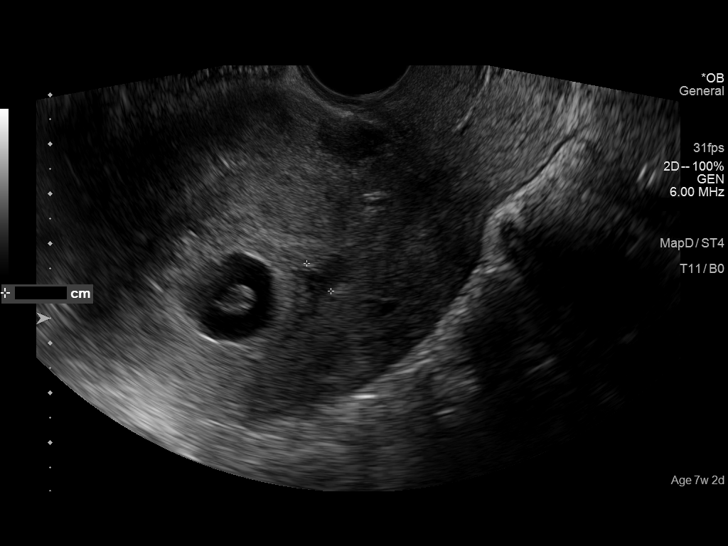
[im 47/55]
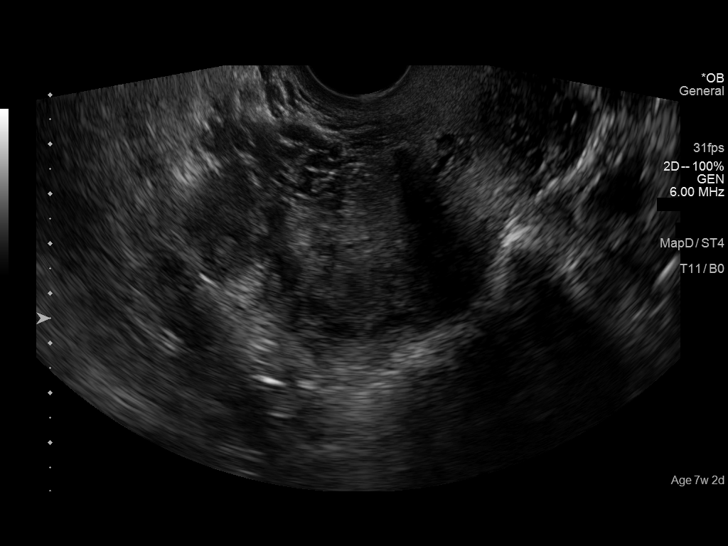
[im 51/55]
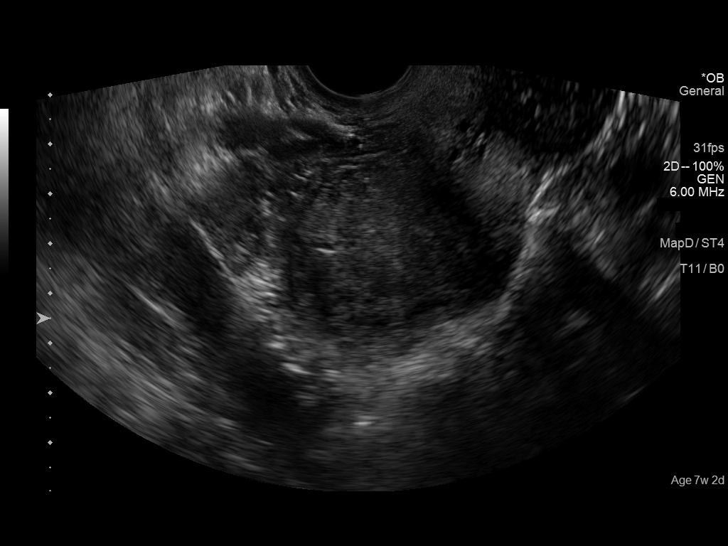
[im 55/55]
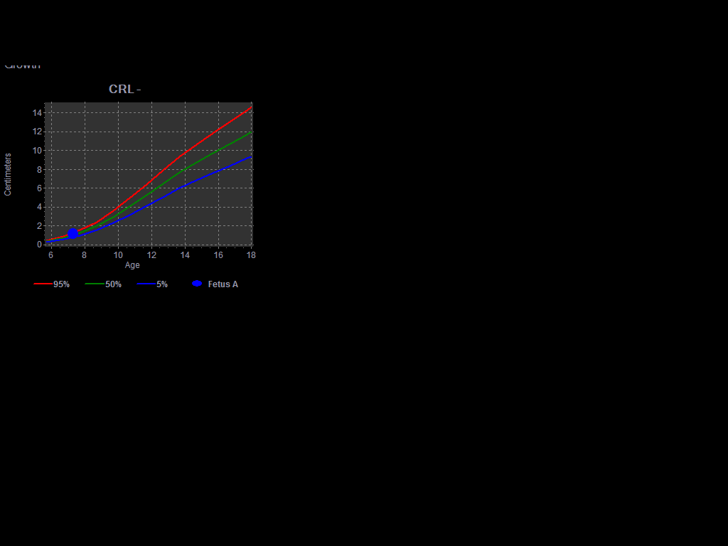

[14 of 28 positions shown; findings below may reference images not displayed]

FINDINGS: Intrauterine gestational sac: Single intrauterine gestational sac.

Yolk sac:  Seen

Embryo:  Present

Cardiac Activity: Detected

Heart Rate: 127  bpm

CRL:  12  Mm   7 w   2 d                  US EDC: 05/13/2016

Subchorionic hemorrhage:  Small subchorionic hemorrhage

Maternal uterus/adnexae: The right ovary measures 2.6 x 1.6 x 1.6 cm
and the left ovary measures 2.6 x 1.9 x 2.2 cm. A a 1.1 x 0.8 x
cm corpus luteum noted in the left ovary.

A 2.4 x 1.9 x 2.3 cm focal heterogeneous area in the anterior uterus
likely represents a fibroid.
IMPRESSION: Single live intrauterine pregnancy with an estimated gestational age
of 7 weeks, 2 days.

Small subchorionic hemorrhage.  Follow-up recommended.

Probable small uterine fibroid.

## 2018-03-25 ENCOUNTER — Ambulatory Visit: Payer: BLUE CROSS/BLUE SHIELD | Admitting: Physician Assistant

## 2018-03-25 ENCOUNTER — Other Ambulatory Visit: Payer: Self-pay

## 2018-03-25 ENCOUNTER — Encounter: Payer: Self-pay | Admitting: Physician Assistant

## 2018-03-25 VITALS — BP 142/97 | HR 87 | Temp 98.5°F | Resp 20 | Ht 68.31 in | Wt 248.6 lb

## 2018-03-25 DIAGNOSIS — Z113 Encounter for screening for infections with a predominantly sexual mode of transmission: Secondary | ICD-10-CM | POA: Diagnosis not present

## 2018-03-25 DIAGNOSIS — M25562 Pain in left knee: Secondary | ICD-10-CM

## 2018-03-25 DIAGNOSIS — Z1329 Encounter for screening for other suspected endocrine disorder: Secondary | ICD-10-CM | POA: Diagnosis not present

## 2018-03-25 DIAGNOSIS — J302 Other seasonal allergic rhinitis: Secondary | ICD-10-CM | POA: Diagnosis not present

## 2018-03-25 DIAGNOSIS — Z13220 Encounter for screening for lipoid disorders: Secondary | ICD-10-CM | POA: Diagnosis not present

## 2018-03-25 DIAGNOSIS — Z1389 Encounter for screening for other disorder: Secondary | ICD-10-CM

## 2018-03-25 DIAGNOSIS — Z13228 Encounter for screening for other metabolic disorders: Secondary | ICD-10-CM | POA: Diagnosis not present

## 2018-03-25 DIAGNOSIS — Z130 Encounter for screening for diseases of the blood and blood-forming organs and certain disorders involving the immune mechanism: Secondary | ICD-10-CM | POA: Diagnosis not present

## 2018-03-25 DIAGNOSIS — Z0000 Encounter for general adult medical examination without abnormal findings: Secondary | ICD-10-CM

## 2018-03-25 DIAGNOSIS — G8929 Other chronic pain: Secondary | ICD-10-CM | POA: Diagnosis not present

## 2018-03-25 MED ORDER — CETIRIZINE HCL 10 MG PO TABS: 10.0000 mg | ORAL_TABLET | Freq: Every day | ORAL | 0 refills | Status: DC
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
## 2018-03-25 NOTE — Patient Instructions (Addendum)
Start a daily multivitamin.  Make sure has calcium and vitamin D in it. Also recommend daily antihistamine for allergies. For knee swelling, use a knee sleeve while you are at work.  You can get this at [facility]. If you continue to have knee swelling and pain despite use of the brace, please return office for further evaluation. Thank you for letting me participate in your health and well being.       IF you received an x-ray today, you will receive an invoice from [facility]. Please contact [facility] at [phone] with questions or concerns regarding your invoice.   IF you received labwork today, you will receive an invoice from [facility]. Please contact [facility] at [phone] with questions or concerns regarding your invoice.   Our billing staff will not be able to assist you with questions regarding bills from these companies.  You will be contacted with the lab results as soon as they are available. The fastest way to get your results is to activate your My Chart account. Instructions are located on the last page of this paperwork. If you have not heard from [place] regarding the results in 2 weeks, please contact this office.    Health Maintenance, Female Adopting a healthy lifestyle and getting preventive care can go a long way to promote health and wellness. Talk with your health care provider about what schedule of regular examinations is right for you. This is a good chance for you to check in with your provider about disease prevention and staying healthy. In between checkups, there are plenty of things you can do on your own. Experts have done a lot of research about which lifestyle changes and preventive measures are most likely to keep you healthy. Ask your health care provider for more information. Weight and diet Eat a healthy diet  Be sure to include plenty of vegetables, fruits, low-fat dairy products, and lean protein.  Do not eat a lot of foods high  in solid fats, added sugars, or salt.  Get regular exercise. This is one of the most important things you can do for your health. ? Most adults should exercise for at least 150 minutes each week. The exercise should increase your heart rate and make you sweat (moderate-intensity exercise). ? Most adults should also do strengthening exercises at least twice a week. This is in addition to the moderate-intensity exercise.  Maintain a healthy weight  Body mass index (BMI) is a measurement that can be used to identify possible weight problems. It estimates body fat based on height and weight. Your health care provider can help determine your BMI and help you achieve or maintain a healthy weight.  For females 70 years of age and older: ? A BMI below 18.5 is considered underweight. ? A BMI of 18.5 to 24.9 is normal. ? A BMI of 25 to 29.9 is considered overweight. ? A BMI of 30 and above is considered obese.  Watch levels of cholesterol and blood lipids  You should start having your blood tested for lipids and cholesterol at 32 years of age, then have this test every 5 years.  You may need to have your cholesterol levels checked more often if: ? Your lipid or cholesterol levels are high. ? You are older than 32 years of age. ? You are at high risk for heart disease.  Cancer screening Lung Cancer  Lung cancer screening is recommended for adults 51-29 years old who are at high risk for lung cancer  because of a history of smoking.  A yearly low-dose CT scan of the lungs is recommended for people who: ? Currently smoke. ? Have quit within the past 15 years. ? Have at least a 30-pack-year history of smoking. A pack year is smoking an average of one pack of cigarettes a day for 1 year.  Yearly screening should continue until it has been 15 years since you quit.  Yearly screening should stop if you develop a health problem that would prevent you from having lung cancer treatment.  Breast  Cancer  Practice breast self-awareness. This means understanding how your breasts normally appear and feel.  It also means doing regular breast self-exams. Let your health care provider know about any changes, no matter how small.  If you are in your 20s or 30s, you should have a clinical breast exam (CBE) by a health care provider every 1-3 years as part of a regular health exam.  If you are 59 or older, have a CBE every year. Also consider having a breast X-ray (mammogram) every year.  If you have a family history of breast cancer, talk to your health care provider about genetic screening.  If you are at high risk for breast cancer, talk to your health care provider about having an MRI and a mammogram every year.  Breast cancer gene (BRCA) assessment is recommended for women who have family members with BRCA-related cancers. BRCA-related cancers include: ? Breast. ? Ovarian. ? Tubal. ? Peritoneal cancers.  Results of the assessment will determine the need for genetic counseling and BRCA1 and BRCA2 testing.  Cervical Cancer Your health care provider may recommend that you be screened regularly for cancer of the pelvic organs (ovaries, uterus, and vagina). This screening involves a pelvic examination, including checking for microscopic changes to the surface of your cervix (Pap test). You may be encouraged to have this screening done every 3 years, beginning at age 67.  For women ages 24-65, health care providers may recommend pelvic exams and Pap testing every 3 years, or they may recommend the Pap and pelvic exam, combined with testing for human papilloma virus (HPV), every 5 years. Some types of HPV increase your risk of cervical cancer. Testing for HPV may also be done on women of any age with unclear Pap test results.  Other health care providers may not recommend any screening for nonpregnant women who are considered low risk for pelvic cancer and who do not have symptoms. Ask your  health care provider if a screening pelvic exam is right for you.  If you have had past treatment for cervical cancer or a condition that could lead to cancer, you need Pap tests and screening for cancer for at least 20 years after your treatment. If Pap tests have been discontinued, your risk factors (such as having a new sexual partner) need to be reassessed to determine if screening should resume. Some women have medical problems that increase the chance of getting cervical cancer. In these cases, your health care provider may recommend more frequent screening and Pap tests.  Colorectal Cancer  This type of cancer can be detected and often prevented.  Routine colorectal cancer screening usually begins at 32 years of age and continues through 32 years of age.  Your health care provider may recommend screening at an earlier age if you have risk factors for colon cancer.  Your health care provider may also recommend using home test kits to check for hidden blood in the stool.  A small camera at the end of a tube can be used to examine your colon directly (sigmoidoscopy or colonoscopy). This is done to check for the earliest forms of colorectal cancer.  Routine screening usually begins at age 22.  Direct examination of the colon should be repeated every 5-10 years through 32 years of age. However, you may need to be screened more often if early forms of precancerous polyps or small growths are found.  Skin Cancer  Check your skin from head to toe regularly.  Tell your health care provider about any new moles or changes in moles, especially if there is a change in a mole's shape or color.  Also tell your health care provider if you have a mole that is larger than the size of a pencil eraser.  Always use sunscreen. Apply sunscreen liberally and repeatedly throughout the day.  Protect yourself by wearing long sleeves, pants, a wide-brimmed hat, and sunglasses whenever you are  outside.  Heart disease, diabetes, and high blood pressure  High blood pressure causes heart disease and increases the risk of stroke. High blood pressure is more likely to develop in: ? People who have blood pressure in the high end of the normal range (130-139/85-89 mm Hg). ? People who are overweight or obese. ? People who are African American.  If you are 79-30 years of age, have your blood pressure checked every 3-5 years. If you are 41 years of age or older, have your blood pressure checked every year. You should have your blood pressure measured twice-once when you are at a hospital or clinic, and once when you are not at a hospital or clinic. Record the average of the two measurements. To check your blood pressure when you are not at a hospital or clinic, you can use: ? An automated blood pressure machine at a pharmacy. ? A home blood pressure monitor.  If you are between 24 years and 41 years old, ask your health care provider if you should take aspirin to prevent strokes.  Have regular diabetes screenings. This involves taking a blood sample to check your fasting blood sugar level. ? If you are at a normal weight and have a low risk for diabetes, have this test once every three years after 32 years of age. ? If you are overweight and have a high risk for diabetes, consider being tested at a younger age or more often. Preventing infection Hepatitis B  If you have a higher risk for hepatitis B, you should be screened for this virus. You are considered at high risk for hepatitis B if: ? You were born in a country where hepatitis B is common. Ask your health care provider which countries are considered high risk. ? Your parents were born in a high-risk country, and you have not been immunized against hepatitis B (hepatitis B vaccine). ? You have HIV or AIDS. ? You use needles to inject street drugs. ? You live with someone who has hepatitis B. ? You have had sex with someone who has  hepatitis B. ? You get hemodialysis treatment. ? You take certain medicines for conditions, including cancer, organ transplantation, and autoimmune conditions.  Hepatitis C  Blood testing is recommended for: ? Everyone born from [birth year] through 1965. ? Anyone with known risk factors for hepatitis C.  Sexually transmitted infections (STIs)  You should be screened for sexually transmitted infections (STIs) including gonorrhea and chlamydia if: ? You are sexually active and are younger than 32  years of age. ? You are older than 33 years of age and your health care provider tells you that you are at risk for this type of infection. ? Your sexual activity has changed since you were last screened and you are at an increased risk for chlamydia or gonorrhea. Ask your health care provider if you are at risk.  If you do not have HIV, but are at risk, it may be recommended that you take a prescription medicine daily to prevent HIV infection. This is called pre-exposure prophylaxis (PrEP). You are considered at risk if: ? You are sexually active and do not regularly use condoms or know the HIV status of your partner(s). ? You take drugs by injection. ? You are sexually active with a partner who has HIV.  Talk with your health care provider about whether you are at high risk of being infected with HIV. If you choose to begin PrEP, you should first be tested for HIV. You should then be tested every 3 months for as long as you are taking PrEP. Pregnancy  If you are premenopausal and you may become pregnant, ask your health care provider about preconception counseling.  If you may become pregnant, take 400 to 800 micrograms (mcg) of folic acid every day.  If you want to prevent pregnancy, talk to your health care provider about birth control (contraception). Osteoporosis and menopause  Osteoporosis is a disease in which the bones lose minerals and strength with aging. This can result in serious bone  fractures. Your risk for osteoporosis can be identified using a bone density scan.  If you are 39 years of age or older, or if you are at risk for osteoporosis and fractures, ask your health care provider if you should be screened.  Ask your health care provider whether you should take a calcium or vitamin D supplement to lower your risk for osteoporosis.  Menopause may have certain physical symptoms and risks.  Hormone replacement therapy may reduce some of these symptoms and risks. Talk to your health care provider about whether hormone replacement therapy is right for you. Follow these instructions at home:  Schedule regular health, dental, and eye exams.  Stay current with your immunizations.  Do not use any tobacco products including cigarettes, chewing tobacco, or electronic cigarettes.  If you are pregnant, do not drink alcohol.  If you are breastfeeding, limit how much and how often you drink alcohol.  Limit alcohol intake to no more than 1 drink per day for nonpregnant women. One drink equals 12 ounces of beer, 5 ounces of wine, or 1 ounces of hard liquor.  Do not use street drugs.  Do not share needles.  Ask your health care provider for help if you need support or information about quitting drugs.  Tell your health care provider if you often feel depressed.  Tell your health care provider if you have ever been abused or do not feel safe at home. This information is not intended to replace advice given to you by your health care provider. Make sure you discuss any questions you have with your health care provider. Document Released: 03/30/2011 Document Revised: 02/20/2016 Document Reviewed: 06/18/2015 Elsevier Interactive Patient Education  [employer].

## 2018-03-26 LAB — LIPID PANEL
CHOL/HDL RATIO: 3 ratio (ref 0.0–4.4)
Cholesterol, Total: 211 mg/dL — ABNORMAL HIGH (ref 100–199)
HDL: 71 mg/dL (ref >39)
LDL Calculated: 119 mg/dL — ABNORMAL HIGH (ref 0–99)
TRIGLYCERIDES: 106 mg/dL (ref 0–149)
VLDL Cholesterol Cal: 21 mg/dL (ref 5–40)

## 2018-03-26 LAB — CMP14+EGFR
A/G RATIO: 1.5 (ref 1.2–2.2)
ALT: 11 IU/L (ref 0–32)
AST: 13 IU/L (ref 0–40)
Albumin: 4.3 g/dL (ref 3.5–5.5)
Alkaline Phosphatase: 48 IU/L (ref 39–117)
BILIRUBIN TOTAL: 0.8 mg/dL (ref 0.0–1.2)
BUN / CREAT RATIO: 10 (ref 9–23)
BUN: 8 mg/dL (ref 6–20)
CO2: 19 mmol/L — ABNORMAL LOW (ref 20–29)
Calcium: 9.5 mg/dL (ref 8.7–10.2)
Chloride: 105 mmol/L (ref 96–106)
Creatinine, Ser: 0.78 mg/dL (ref 0.57–1.00)
GFR calc Af Amer: 117 mL/min/{1.73_m2} (ref >59)
GFR, EST NON AFRICAN AMERICAN: 102 mL/min/{1.73_m2} (ref >59)
Globulin, Total: 2.8 g/dL (ref 1.5–4.5)
Glucose: 95 mg/dL (ref 65–99)
POTASSIUM: 4.3 mmol/L (ref 3.5–5.2)
SODIUM: 138 mmol/L (ref 134–144)
Total Protein: 7.1 g/dL (ref 6.0–8.5)

## 2018-03-26 LAB — URINALYSIS, DIPSTICK ONLY
BILIRUBIN UA: NEGATIVE
Glucose, UA: NEGATIVE
Leukocytes, UA: NEGATIVE
NITRITE UA: POSITIVE — AB
PH UA: 5.5 (ref 5.0–7.5)
Protein, UA: NEGATIVE
RBC UA: NEGATIVE
SPEC GRAV UA: 1.023 (ref 1.005–1.030)
UUROB: 0.2 mg/dL (ref 0.2–1.0)

## 2018-03-26 LAB — CBC WITH DIFFERENTIAL/PLATELET
BASOS ABS: 0 10*3/uL (ref 0.0–0.2)
BASOS: 1 %
EOS (ABSOLUTE): 0.1 10*3/uL (ref 0.0–0.4)
Eos: 2 %
Hematocrit: 38.9 % (ref 34.0–46.6)
Hemoglobin: 13 g/dL (ref 11.1–15.9)
Immature Grans (Abs): 0 10*3/uL (ref 0.0–0.1)
Immature Granulocytes: 0 %
Lymphocytes Absolute: 2.5 10*3/uL (ref 0.7–3.1)
Lymphs: 51 %
MCH: 30.6 pg (ref 26.6–33.0)
MCHC: 33.4 g/dL (ref 31.5–35.7)
MCV: 92 fL (ref 79–97)
MONOS ABS: 0.5 10*3/uL (ref 0.1–0.9)
Monocytes: 10 %
NEUTROS ABS: 1.8 10*3/uL (ref 1.4–7.0)
NEUTROS PCT: 36 %
PLATELETS: 282 10*3/uL (ref 150–450)
RBC: 4.25 x10E6/uL (ref 3.77–5.28)
RDW: 13.5 % (ref 12.3–15.4)
WBC: 5 10*3/uL (ref 3.4–10.8)

## 2018-03-26 LAB — GC/CHLAMYDIA PROBE AMP
CHLAMYDIA, DNA PROBE: NEGATIVE
NEISSERIA GONORRHOEAE BY PCR: NEGATIVE

## 2018-03-26 LAB — HEPATITIS PANEL, ACUTE
HEP A IGM: NEGATIVE
Hep B C IgM: NEGATIVE
Hep C Virus Ab: <0.1 s/co ratio (ref 0.0–0.9)
Hepatitis B Surface Ag: NEGATIVE

## 2018-03-26 LAB — HIV ANTIBODY (ROUTINE TESTING W REFLEX): HIV Screen 4th Generation wRfx: NONREACTIVE

## 2018-03-26 LAB — RPR: RPR Ser Ql: NONREACTIVE

## 2018-03-26 LAB — TSH: TSH: 2.98 u[IU]/mL (ref 0.450–4.500)

## 2018-03-26 LAB — TRICHOMONAS VAGINALIS, PROBE AMP: TRICH VAG BY NAA: NEGATIVE

## 2019-12-01 ENCOUNTER — Other Ambulatory Visit: Payer: Self-pay | Admitting: Obstetrics & Gynecology

## 2024-04-11 ENCOUNTER — Ambulatory Visit: Payer: Self-pay

## 2024-04-11 NOTE — Telephone Encounter (Signed)
   FYI Only or Action Required?: FYI only for provider.  Patient was last seen in primary care on N/a  Called Nurse Triage reporting No chief complaint on file..  Symptoms began several months ago.  Interventions attempted: Nothing.  Symptoms are: stable.  Triage Disposition: See PCP Within 2 Weeks, See Physician Within 24 Hours, See HCP Within 4 Hours (Or PCP Triage)  Patient/caregiver understands and will follow disposition?: YesCopied from CRM [id number]. Topic: Clinical - Red Word Triage >> Apr 11, 2024 11:16 AM Graeme ORN wrote: Red Word that prompted transfer to Nurse Triage: dizziness, nausea, light sensitivity    ----------------------------------------------------------------------- From previous Reason for Contact - Scheduling: Patient/patient representative is calling to schedule an appointment. Refer to attachments for appointment information. Reason for Disposition  [1] Dizziness caused by heat exposure, sudden standing, or poor fluid intake AND [2] no improvement after 2 hours of rest and fluids  [1] MODERATE dizziness (e.g., interferes with normal activities) AND [2] has NOT been evaluated by doctor (or NP/PA) for this  (Exception: Dizziness caused by heat exposure, sudden standing, or poor fluid intake.)  Dizziness is a chronic symptom (recurrent or ongoing AND present > 4 weeks)  Answer Assessment - Initial Assessment Questions 1. DESCRIPTION: Describe your dizziness.     *No Answer*   2. LIGHTHEADED: Do you feel lightheaded? (e.g., somewhat faint, woozy, weak upon standing)     --- Intermittent, Happens usually upon standing.    3. VERTIGO: Do you feel like either you or the room is spinning or tilting? (i.e., vertigo)     ---------------- She is spinning    4. SEVERITY: How bad is it?  Do you feel like you are going to faint? Can you stand and walk?     -------Can complete ADL   5. ONSET:  When did the dizziness begin?     ----------Jan  2025, progressively getting worse.    6. AGGRAVATING FACTORS: Does anything make it worse? (e.g., standing, change in head position)     -changing head position    8. CAUSE: What do you think is causing the dizziness? (e.g., decreased fluids or food, diarrhea, emotional distress, heat exposure, new medicine, sudden standing, vomiting; unknown)     ------------- Unsure    9. RECURRENT SYMPTOM: Have you had dizziness before? If Yes, ask: When was the last time? What happened that time?     --------Yes    10. OTHER SYMPTOMS: Do you have any other symptoms? (e.g., fever, chest pain, vomiting, diarrhea, bleeding)       ---Nausea  --------- Light sensitivity   11. PREGNANCY: Is there any chance you are pregnant? When was your last menstrual period?       -----------Denies     Additional Information: Pt reports this has been an ongoing issue since the beginning of the year, she is noticing it is now more frequent.   Appointment scheduled for patient appropriately.  Patient educated on pertinent s/s that would warrant emergent help/call 911/ ED/UC.  Patient verbalized understanding and agrees with plan . No additional questions/concerns noted during the time of the call.  Protocols used: Dizziness - Lightheadedness-A-AH

## 2024-05-05 ENCOUNTER — Ambulatory Visit: Payer: Self-pay | Admitting: Physician Assistant

## 2024-07-06 ENCOUNTER — Ambulatory Visit: Payer: Self-pay | Admitting: Family Medicine

## 2024-10-20 ENCOUNTER — Ambulatory Visit: Payer: Self-pay

## 2024-11-02 ENCOUNTER — Ambulatory Visit

## 2024-11-02 ENCOUNTER — Encounter: Payer: Self-pay | Admitting: Family Medicine

## 2024-11-02 ENCOUNTER — Ambulatory Visit: Admitting: Family Medicine

## 2024-11-02 ENCOUNTER — Ambulatory Visit: Payer: Self-pay

## 2024-11-02 VITALS — BP 160/90 | HR 74 | Temp 98.6°F | Ht 68.31 in | Wt 292.0 lb

## 2024-11-02 DIAGNOSIS — I10 Essential (primary) hypertension: Secondary | ICD-10-CM

## 2024-11-02 DIAGNOSIS — Z7689 Persons encountering health services in other specified circumstances: Secondary | ICD-10-CM

## 2024-11-02 DIAGNOSIS — R079 Chest pain, unspecified: Secondary | ICD-10-CM

## 2024-11-02 DIAGNOSIS — Z136 Encounter for screening for cardiovascular disorders: Secondary | ICD-10-CM

## 2024-11-02 DIAGNOSIS — L84 Corns and callosities: Secondary | ICD-10-CM

## 2024-11-02 DIAGNOSIS — R7309 Other abnormal glucose: Secondary | ICD-10-CM

## 2024-11-02 LAB — POCT URINALYSIS DIP (CLINITEK)
Bilirubin, UA: NEGATIVE
Blood, UA: NEGATIVE
Glucose, UA: NEGATIVE mg/dL
Leukocytes, UA: NEGATIVE
Nitrite, UA: NEGATIVE
POC PROTEIN,UA: NEGATIVE
Spec Grav, UA: 1.025
Urobilinogen, UA: 0.2 U/dL
pH, UA: 5.5

## 2024-11-02 MED ORDER — OLMESARTAN MEDOXOMIL 40 MG PO TABS: 40.0000 mg | ORAL_TABLET | Freq: Every day | ORAL | 2 refills | Status: AC

## 2024-11-02 NOTE — Telephone Encounter (Signed)
 FYI Only or Action Required?: FYI only for provider:  .  Patient was last seen in primary care on .  Called Nurse Triage reporting Callouses.  Symptoms began several weeks ago.  Interventions attempted: Rest, hydration, or home remedies.  Symptoms are: stable.  Triage Disposition: See PCP When Office is Open (Within 3 Days)  Patient/caregiver understands and will follow disposition?: Yes  Message from Sophia H sent at 11/02/2024  1:17 PM EST  Reason for Triage: Swelling in left leg, pain in knees - callus on bottom of left foot that she had surgery for prior and she believes the problem has returned.   Reason for Disposition  [1] MODERATE pain (e.g., interferes with normal activities, limping) AND [2] present > 3 days  Answer Assessment - Initial Assessment Questions 2024 had surgery on L and R feet   1. ONSET: When did the pain start?      2-3 months, 2-3 weeks  2. LOCATION: Where is the pain located?      L foot under toes dime sized  3. PAIN: How bad is the pain?    (Scale 1-10; or mild, moderate, severe)     Moderate  5. CAUSE: What do you think is causing the foot pain?     Unsure if surgery wasn't effective  6. OTHER SYMPTOMS: Do you have any other symptoms? (e.g., leg pain, rash, fever, numbness)     Leg pain and knee pain  Protocols used: Foot Pain-A-AH

## 2024-11-02 NOTE — Progress Notes (Unsigned)
 EP to read.

## 2024-11-02 NOTE — Progress Notes (Unsigned)
 I,Jameka J Llittleton, CMA,acting as a neurosurgeon for [employer], NP.,have documented all relevant documentation on the behalf of Bruna Creighton, NP,as directed by  Bruna Creighton, NP while in the presence of Bruna Creighton, NP.  Subjective:  Patient ID: [name] , female    DOB: [birth year] , 39 y.o.   MRN: [id number]  Chief Complaint  Patient presents with   Establish Care    Patient presents today to establish care. She hasn't seen a pcp in the past year.   Chest Pain    Patient reports she has been having chest pains for the past couple of years on and off. She reports everyone keeps doing EKGs and nothing is wrong. She would like an MRI done.    calous    Patient reports she has calous on her left foot. She reports the calous is about the size of the dime and is causing her a lot of pain.     HPI  Chest Pain      Past Medical History:  Diagnosis Date   Arthritis    Osteoarthritis      Family History  Problem Relation Age of Onset   Arthritis Mother    Diabetes Mother    Glaucoma Mother    Diabetes Father    Heart disease Father    Congestive Heart Failure Father    Diabetes Paternal Grandmother     Current Medications[1]   Allergies[2]   Review of Systems  Constitutional: Negative.   Eyes: Negative.   Cardiovascular:  Negative for chest pain.  Musculoskeletal: Negative.   Skin: Negative.   Psychiatric/Behavioral: Negative.       Today's Vitals   11/02/24 1410  BP: (!) 160/90  Pulse: 74  Temp: 98.6 F (37 C)  TempSrc: Oral  Weight: 292 lb (132.5 kg)  Height: 5' 8.31 (1.735 m)  PainSc: 6   PainLoc: Foot   Body mass index is 44 kg/m.  Wt Readings from Last 3 Encounters:  11/02/24 292 lb (132.5 kg)  03/25/18 248 lb 9.6 oz (112.8 kg)  09/26/15 261 lb (118.4 kg)    The ASCVD Risk score (Arnett DK, et al., 2019) failed to calculate for the following reasons:   The 2019 ASCVD risk score is only valid for ages 43 to 58  Objective:  Physical Exam       Assessment And Plan:   Assessment & Plan Encounter to establish care with new doctor  Callus  Chest pain, unspecified type  Class 3 severe obesity due to excess calories with body mass index (BMI) of 40.0 to 44.9 in adult, unspecified whether serious comorbidity present (HCC)   No orders of the defined types were placed in this encounter.    Return if symptoms worsen or fail to improve.  Patient was given opportunity to ask questions. Patient verbalized understanding of the plan and was able to repeat key elements of the plan. All questions were answered to their satisfaction.    I, Bruna Creighton, NP, have reviewed all documentation for this visit. The documentation on 11/02/24 for the exam, diagnosis, procedures, and orders are all accurate and complete.   IF YOU HAVE BEEN REFERRED TO A SPECIALIST, IT MAY TAKE 1-2 WEEKS TO SCHEDULE/PROCESS THE REFERRAL. IF YOU HAVE NOT HEARD FROM US /SPECIALIST IN TWO WEEKS, PLEASE GIVE US  A CALL AT [phone] X [phone].     [1]  Current Outpatient Medications:    etonogestrel (NEXPLANON) 68 MG IMPL implant, 68 mg by Subdermal route  once., Disp: , Rfl:  [2] No Known Allergies

## 2024-11-03 LAB — CBC WITH DIFFERENTIAL/PLATELET
Basophils Absolute: 0.1 10*3/uL (ref 0.0–0.2)
Basos: 1 %
EOS (ABSOLUTE): 0.1 10*3/uL (ref 0.0–0.4)
Eos: 1 %
Hematocrit: 41 % (ref 34.0–46.6)
Hemoglobin: 13.8 g/dL (ref 11.1–15.9)
Immature Grans (Abs): 0 10*3/uL (ref 0.0–0.1)
Immature Granulocytes: 0 %
Lymphocytes Absolute: 2.8 10*3/uL (ref 0.7–3.1)
Lymphs: 51 %
MCH: 31.8 pg (ref 26.6–33.0)
MCHC: 33.7 g/dL (ref 31.5–35.7)
MCV: 95 fL (ref 79–97)
Monocytes Absolute: 0.7 10*3/uL (ref 0.1–0.9)
Monocytes: 13 %
Neutrophils Absolute: 1.9 10*3/uL (ref 1.4–7.0)
Neutrophils: 34 %
Platelets: 295 10*3/uL (ref 150–450)
RBC: 4.34 x10E6/uL (ref 3.77–5.28)
RDW: 12.6 % (ref 11.7–15.4)
WBC: 5.5 10*3/uL (ref 3.4–10.8)

## 2024-11-03 LAB — COMPREHENSIVE METABOLIC PANEL WITH GFR
ALT: 10 [IU]/L (ref 0–32)
AST: 12 [IU]/L (ref 0–40)
Albumin: 4.7 g/dL (ref 3.9–4.9)
Alkaline Phosphatase: 74 [IU]/L (ref 41–116)
BUN/Creatinine Ratio: 9 (ref 9–23)
BUN: 7 mg/dL (ref 6–20)
Bilirubin Total: 0.7 mg/dL (ref 0.0–1.2)
CO2: 19 mmol/L — ABNORMAL LOW (ref 20–29)
Calcium: 9.6 mg/dL (ref 8.7–10.2)
Chloride: 102 mmol/L (ref 96–106)
Creatinine, Ser: 0.79 mg/dL (ref 0.57–1.00)
Globulin, Total: 2.3 g/dL (ref 1.5–4.5)
Glucose: 85 mg/dL (ref 70–99)
Potassium: 4.5 mmol/L (ref 3.5–5.2)
Sodium: 139 mmol/L (ref 134–144)
Total Protein: 7 g/dL (ref 6.0–8.5)
eGFR: 98 mL/min/{1.73_m2}

## 2024-11-03 LAB — LIPID PANEL
Chol/HDL Ratio: 4.8 ratio — ABNORMAL HIGH (ref 0.0–4.4)
Cholesterol, Total: 240 mg/dL — ABNORMAL HIGH (ref 100–199)
HDL: 50 mg/dL
LDL Chol Calc (NIH): 182 mg/dL — ABNORMAL HIGH (ref 0–99)
Triglycerides: 49 mg/dL (ref 0–149)
VLDL Cholesterol Cal: 8 mg/dL (ref 5–40)

## 2024-11-03 LAB — HEMOGLOBIN A1C
Est. average glucose Bld gHb Est-mCnc: 111 mg/dL
Hgb A1c MFr Bld: 5.5 % (ref 4.8–5.6)

## 2024-11-03 LAB — MICROALBUMIN / CREATININE URINE RATIO
Creatinine, Urine: 155.4 mg/dL
Microalb/Creat Ratio: 2 mg/g{creat} (ref 0–29)
Microalbumin, Urine: 3.1 ug/mL

## 2024-11-09 ENCOUNTER — Ambulatory Visit: Admitting: Podiatry

## 2024-11-14 ENCOUNTER — Ambulatory Visit: Payer: Self-pay

## 2024-11-14 ENCOUNTER — Other Ambulatory Visit: Payer: Self-pay

## 2025-03-07 ENCOUNTER — Ambulatory Visit: Payer: Self-pay | Admitting: Family Medicine
